# Patient Record
Sex: Male | Born: 1990 | Race: White | Hispanic: No | Marital: Single | Smoking: Current every day smoker
Health system: Southern US, Community
[De-identification: ages and names within clinical notes are randomized; demographics above are authoritative.]

## PROBLEM LIST (undated history)

## (undated) DIAGNOSIS — F1910 Other psychoactive substance abuse, uncomplicated: Secondary | ICD-10-CM

## (undated) DIAGNOSIS — R569 Unspecified convulsions: Secondary | ICD-10-CM

---
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
## 2013-01-11 ENCOUNTER — Inpatient Hospital Stay: Payer: Self-pay | Admitting: Psychiatry

## 2013-01-11 LAB — URINALYSIS, COMPLETE
Bacteria: NONE SEEN
Bilirubin,UR: NEGATIVE
Glucose,UR: NEGATIVE mg/dL (ref 0–75)
Ketone: NEGATIVE
Leukocyte Esterase: NEGATIVE
Nitrite: NEGATIVE
Ph: 5 (ref 4.5–8.0)
Protein: NEGATIVE
RBC,UR: <1 /HPF (ref 0–5)
Specific Gravity: 1.013 (ref 1.003–1.030)
Squamous Epithelial: <1
WBC UR: 1 /HPF (ref 0–5)

## 2013-01-11 LAB — DRUG SCREEN, URINE
Barbiturates, Ur Screen: NEGATIVE (ref ?–200)
Cannabinoid 50 Ng, Ur ~~LOC~~: POSITIVE (ref ?–50)
Cocaine Metabolite,Ur ~~LOC~~: NEGATIVE (ref ?–300)
Methadone, Ur Screen: NEGATIVE (ref ?–300)
Opiate, Ur Screen: POSITIVE (ref ?–300)
Tricyclic, Ur Screen: NEGATIVE (ref ?–1000)

## 2013-01-11 LAB — COMPREHENSIVE METABOLIC PANEL
Albumin: 4.8 g/dL (ref 3.4–5.0)
Alkaline Phosphatase: 93 U/L (ref 50–136)
Anion Gap: 13 (ref 7–16)
BUN: 11 mg/dL (ref 7–18)
Bilirubin,Total: 0.3 mg/dL (ref 0.2–1.0)
Calcium, Total: 9.2 mg/dL (ref 8.5–10.1)
Chloride: 106 mmol/L (ref 98–107)
Co2: 23 mmol/L (ref 21–32)
Creatinine: 0.89 mg/dL (ref 0.60–1.30)
EGFR (African American): >60
EGFR (Non-African Amer.): >60
Glucose: 122 mg/dL — ABNORMAL HIGH (ref 65–99)
Osmolality: 284 (ref 275–301)
Potassium: 3.6 mmol/L (ref 3.5–5.1)
SGOT(AST): 63 U/L — ABNORMAL HIGH (ref 15–37)
SGPT (ALT): 58 U/L (ref 12–78)
Sodium: 142 mmol/L (ref 136–145)
Total Protein: 8.5 g/dL — ABNORMAL HIGH (ref 6.4–8.2)

## 2013-01-11 LAB — CBC
HCT: 47 % (ref 40.0–52.0)
HGB: 16.2 g/dL (ref 13.0–18.0)
MCH: 33 pg (ref 26.0–34.0)
MCHC: 34.4 g/dL (ref 32.0–36.0)
MCV: 96 fL (ref 80–100)
Platelet: 229 10*3/uL (ref 150–440)
RBC: 4.91 10*6/uL (ref 4.40–5.90)
RDW: 13.8 % (ref 11.5–14.5)
WBC: 10.7 10*3/uL — ABNORMAL HIGH (ref 3.8–10.6)

## 2013-01-11 LAB — ETHANOL: Ethanol: 197 mg/dL

## 2013-03-02 ENCOUNTER — Inpatient Hospital Stay: Payer: Self-pay | Admitting: Internal Medicine

## 2013-03-02 LAB — COMPREHENSIVE METABOLIC PANEL
Albumin: 5.4 g/dL — ABNORMAL HIGH (ref 3.4–5.0)
Anion Gap: 13 (ref 7–16)
BUN: 58 mg/dL — ABNORMAL HIGH (ref 7–18)
Bilirubin,Total: 1.3 mg/dL — ABNORMAL HIGH (ref 0.2–1.0)
Calcium, Total: 9.9 mg/dL (ref 8.5–10.1)
Co2: 27 mmol/L (ref 21–32)
Creatinine: 2.47 mg/dL — ABNORMAL HIGH (ref 0.60–1.30)
EGFR (African American): 42 — ABNORMAL LOW
EGFR (Non-African Amer.): 36 — ABNORMAL LOW
Glucose: 156 mg/dL — ABNORMAL HIGH (ref 65–99)
Potassium: 3.4 mmol/L — ABNORMAL LOW (ref 3.5–5.1)
Total Protein: 9.7 g/dL — ABNORMAL HIGH (ref 6.4–8.2)

## 2013-03-02 LAB — OSMOLALITY: Osmolality: 285 mOsm/kg (ref 275–295)

## 2013-03-02 LAB — URINALYSIS, COMPLETE
Glucose,UR: NEGATIVE mg/dL (ref 0–75)
Hyaline Cast: 11
Ketone: NEGATIVE
Nitrite: NEGATIVE
Protein: 150
Specific Gravity: 1.03 (ref 1.003–1.030)
WBC UR: 3 /HPF (ref 0–5)

## 2013-03-02 LAB — CBC
HCT: 56.4 % — ABNORMAL HIGH (ref 40.0–52.0)
HGB: 20.4 g/dL — ABNORMAL HIGH (ref 13.0–18.0)
MCHC: 36.2 g/dL — ABNORMAL HIGH (ref 32.0–36.0)
MCV: 94 fL (ref 80–100)

## 2013-03-02 LAB — CK: CK, Total: 1346 U/L — ABNORMAL HIGH (ref 35–232)

## 2013-03-02 LAB — LIPASE, BLOOD: Lipase: 107 U/L (ref 73–393)

## 2013-03-03 LAB — HEPATIC FUNCTION PANEL A (ARMC)
Albumin: 3.5 g/dL (ref 3.4–5.0)
Alkaline Phosphatase: 62 U/L (ref 50–136)
Bilirubin, Direct: 0.3 mg/dL — ABNORMAL HIGH (ref 0.00–0.20)
Bilirubin,Total: 1.3 mg/dL — ABNORMAL HIGH (ref 0.2–1.0)
Total Protein: 6.4 g/dL (ref 6.4–8.2)

## 2013-03-03 LAB — CBC WITH DIFFERENTIAL/PLATELET
Basophil #: 0 10*3/uL (ref 0.0–0.1)
Eosinophil #: 0 10*3/uL (ref 0.0–0.7)
Eosinophil %: 0.4 %
HCT: 43.6 % (ref 40.0–52.0)
HGB: 15.3 g/dL (ref 13.0–18.0)
Lymphocyte %: 15.4 %
MCHC: 35 g/dL (ref 32.0–36.0)
MCV: 96 fL (ref 80–100)
Monocyte #: 0.9 x10 3/mm (ref 0.2–1.0)
Neutrophil #: 5 10*3/uL (ref 1.4–6.5)
Neutrophil %: 70.7 %
Platelet: 159 10*3/uL (ref 150–440)
RBC: 4.55 10*6/uL (ref 4.40–5.90)
WBC: 7.1 10*3/uL (ref 3.8–10.6)

## 2013-03-03 LAB — MAGNESIUM: Magnesium: 2 mg/dL

## 2013-03-03 LAB — BASIC METABOLIC PANEL
Anion Gap: 6 — ABNORMAL LOW (ref 7–16)
Calcium, Total: 8.5 mg/dL (ref 8.5–10.1)
Chloride: 94 mmol/L — ABNORMAL LOW (ref 98–107)
Co2: 30 mmol/L (ref 21–32)
Creatinine: 1.46 mg/dL — ABNORMAL HIGH (ref 0.60–1.30)
Osmolality: 268 (ref 275–301)
Sodium: 130 mmol/L — ABNORMAL LOW (ref 136–145)

## 2013-05-12 ENCOUNTER — Emergency Department: Payer: Self-pay | Admitting: Emergency Medicine

## 2013-05-12 LAB — CBC WITH DIFFERENTIAL/PLATELET
Basophil #: 0.1 10*3/uL (ref 0.0–0.1)
Basophil %: 0.7 %
Eosinophil #: 0.1 10*3/uL (ref 0.0–0.7)
Eosinophil %: 1.4 %
Lymphocyte %: 29.8 %
MCH: 33.5 pg (ref 26.0–34.0)
MCV: 97 fL (ref 80–100)
Monocyte %: 8.8 %
Neutrophil #: 4.4 10*3/uL (ref 1.4–6.5)
Neutrophil %: 59.3 %
Platelet: 201 10*3/uL (ref 150–440)
RBC: 4.52 10*6/uL (ref 4.40–5.90)
RDW: 13.4 % (ref 11.5–14.5)

## 2013-05-12 LAB — BASIC METABOLIC PANEL
Anion Gap: 3 — ABNORMAL LOW (ref 7–16)
BUN: 9 mg/dL (ref 7–18)
Calcium, Total: 9.4 mg/dL (ref 8.5–10.1)
Chloride: 103 mmol/L (ref 98–107)
Chloride: 107 mmol/L (ref 98–107)
Co2: 16 mmol/L — ABNORMAL LOW (ref 21–32)
Co2: 28 mmol/L (ref 21–32)
Creatinine: 1.5 mg/dL — ABNORMAL HIGH (ref 0.60–1.30)
EGFR (African American): >60
EGFR (African American): >60
EGFR (Non-African Amer.): >60
Osmolality: 275 (ref 275–301)
Osmolality: 274 (ref 275–301)
Potassium: 3.3 mmol/L — ABNORMAL LOW (ref 3.5–5.1)
Potassium: 3.4 mmol/L — ABNORMAL LOW (ref 3.5–5.1)
Sodium: 137 mmol/L (ref 136–145)
Sodium: 138 mmol/L (ref 136–145)

## 2013-05-12 LAB — CK: CK, Total: 142 U/L (ref 35–232)

## 2013-05-12 LAB — MAGNESIUM: Magnesium: 1.5 mg/dL — ABNORMAL LOW

## 2014-01-14 ENCOUNTER — Emergency Department (HOSPITAL_COMMUNITY)
Admission: EM | Admit: 2014-01-14 | Discharge: 2014-01-14 | Disposition: A | Discharge status: Home / Self Care | Payer: Self-pay | Attending: Emergency Medicine | Admitting: Emergency Medicine

## 2014-01-14 ENCOUNTER — Encounter (HOSPITAL_COMMUNITY): Payer: Self-pay | Admitting: Emergency Medicine

## 2014-01-14 DIAGNOSIS — S1093XA Contusion of unspecified part of neck, initial encounter: Secondary | ICD-10-CM

## 2014-01-14 DIAGNOSIS — G40909 Epilepsy, unspecified, not intractable, without status epilepticus: Secondary | ICD-10-CM | POA: Insufficient documentation

## 2014-01-14 DIAGNOSIS — W1809XA Striking against other object with subsequent fall, initial encounter: Secondary | ICD-10-CM | POA: Insufficient documentation

## 2014-01-14 DIAGNOSIS — R000 Tachycardia, unspecified: Secondary | ICD-10-CM | POA: Insufficient documentation

## 2014-01-14 DIAGNOSIS — S0003XA Contusion of scalp, initial encounter: Secondary | ICD-10-CM | POA: Insufficient documentation

## 2014-01-14 DIAGNOSIS — F17200 Nicotine dependence, unspecified, uncomplicated: Secondary | ICD-10-CM | POA: Insufficient documentation

## 2014-01-14 DIAGNOSIS — S0083XA Contusion of other part of head, initial encounter: Secondary | ICD-10-CM | POA: Insufficient documentation

## 2014-01-14 DIAGNOSIS — Y939 Activity, unspecified: Secondary | ICD-10-CM | POA: Insufficient documentation

## 2014-01-14 DIAGNOSIS — Y929 Unspecified place or not applicable: Secondary | ICD-10-CM | POA: Insufficient documentation

## 2014-01-14 HISTORY — DX: Unspecified convulsions: R56.9

## 2014-01-14 LAB — I-STAT CHEM 8, ED
BUN: 8 mg/dL (ref 6–23)
CALCIUM ION: 1.25 mmol/L — AB (ref 1.12–1.23)
CHLORIDE: 103 meq/L (ref 96–112)
CREATININE: 1.1 mg/dL (ref 0.50–1.35)
Glucose, Bld: 124 mg/dL — ABNORMAL HIGH (ref 70–99)
HCT: 59 % — ABNORMAL HIGH (ref 39.0–52.0)
Hemoglobin: 20.1 g/dL — ABNORMAL HIGH (ref 13.0–17.0)
Potassium: 3.3 mEq/L — ABNORMAL LOW (ref 3.7–5.3)
Sodium: 144 mEq/L (ref 137–147)
TCO2: 17 mmol/L (ref 0–100)

## 2014-01-14 MED ORDER — ACETAMINOPHEN 325 MG PO TABS
975.0000 mg | ORAL_TABLET | Freq: Once | ORAL | Status: AC
  Administered 2014-01-14: 975 mg via ORAL
  Filled 2014-01-14: qty 3

## 2014-01-14 MED ORDER — POTASSIUM CHLORIDE CRYS ER 20 MEQ PO TBCR
40.0000 meq | EXTENDED_RELEASE_TABLET | Freq: Once | ORAL | Status: AC
  Administered 2014-01-14: 40 meq via ORAL
  Filled 2014-01-14: qty 2

## 2014-01-14 MED ORDER — LEVETIRACETAM IN NACL 500 MG/100ML IV SOLN
500.0000 mg | Freq: Once | INTRAVENOUS | Status: AC
  Administered 2014-01-14: 500 mg via INTRAVENOUS
  Filled 2014-01-14 (×2): qty 100

## 2014-01-14 NOTE — ED Notes (Signed)
Pt had witnessed seizure in waiting area while visiting family member. Family member states that pt fell forward and hit head on floor. On Emergency team arrival pts head was stabilized, pt rolled onto backboard and transported to room. Pt placed in C collar. Family accompanied pt.

## 2014-01-14 NOTE — Discharge Instructions (Signed)
Epilepsy Take  Your seizure medications as prescribed to prevent further seizures People with epilepsy have times when they shake and jerk uncontrollably (seizures). This happens when there is a sudden change in brain function. Epilepsy may have many possible causes. Anything that disturbs the normal pattern of brain cell activity can lead to seizures. HOME CARE   Follow your doctor's instructions about driving and safety during normal activities.  Get enough sleep.  Only take medicine as told by your doctor.  Avoid things that you know can cause you to have seizures (triggers).  Write down when your seizures happen and what you remember about each seizure. Write down anything you think may have caused the seizure to happen.  Tell the people you live and work with that you have seizures. Make sure they know how to help you. They should:  Cushion your head and body.  Turn you on your side.  Not restrain you.  Not place anything inside your mouth.  Call for local emergency medical help if there is any question about what has happened.  Keep all follow-up visits with your doctor. This is very important. GET HELP IF:  You get an infection or start to feel sick. You may have more seizures when you are sick.  You are having seizures more often.  Your seizure pattern is changing. GET HELP RIGHT AWAY IF:   A seizure does not stop after a few seconds or minutes.  A seizure causes you to have trouble breathing.  A seizure gives you a very bad headache.  A seizure makes you unable to speak or use a part of your body. Document Released: 04/24/2009 Document Revised: 04/17/2013 Document Reviewed: 02/06/2013 [facility] Patient Information 2015 [employer] [place], [place]. This information is not intended to replace advice given to you by your health care provider. Make sure you discuss any questions you have with your health care provider.

## 2014-01-14 NOTE — ED Provider Notes (Addendum)
CSN: [id number]     Arrival date & time 01/14/14  1846 History   First MD Initiated Contact with Patient 01/14/14 1852     Chief Complaint  Patient presents with  . Seizures     (Consider location/radiation/quality/duration/timing/severity/associated sxs/prior Treatment) HPI Patient had generalized seizure today, immediately prior to arrival while visiting another patient in the hospital. He did not take his anticonvulsant medications today. He is presently asymptomatic except for mild frontal headache. He struck his for head on the floor when he had the seizure. He denies neck pain no focal numbness or weakness no other associated complaint. No treatment prior to coming here. Past Medical History  Diagnosis Date  . Seizures    History reviewed. No pertinent past surgical history. No family history on file. History  Substance Use Topics  . Smoking status: Current Every Day Smoker -- 6.00 packs/day    Types: Cigarettes  . Smokeless tobacco: Not on file  . Alcohol Use: Not on file   positive alcohol use last alcohol use 3 days ago. Denies drug use  Review of Systems  Skin: Positive for wound.       Hematoma to forehead  Neurological: Positive for headaches.  All other systems reviewed and are negative.     Allergies  Review of patient's allergies indicates no known allergies.  Home Medications   Prior to Admission medications   Not on File   BP 161/81  Pulse 133  Resp 23  Ht 6\' 2"  (1.88 m)  Wt 148 lb (67.132 kg)  BMI 18.99 kg/m2  SpO2 98% Physical Exam  Nursing note and vitals reviewed. Constitutional: He is oriented to person, place, and time. He appears well-developed and well-nourished. No distress.  Glasgow Coma Score 15  HENT:  Head: Normocephalic and atraumatic.  Golf ball size hematoma to 4 head otherwise atraumatic. Bilateral tympanic membranes normal to  Eyes: Conjunctivae and EOM are normal. Pupils are equal, round, and reactive to light.  Pupils 3 mm  round bilaterally and equal  Neck: Neck supple. No tracheal deviation present. No thyromegaly present.  Cardiovascular: Regular rhythm.   No murmur heard. Tachycardic  Pulmonary/Chest: Effort normal and breath sounds normal.  Abdominal: Soft. Bowel sounds are normal. He exhibits no distension. There is no tenderness.  Musculoskeletal: Normal range of motion. He exhibits no edema and no tenderness.  Entire spine nontender  Neurological: He is alert and oriented to person, place, and time. He has normal reflexes. No cranial nerve deficit. Coordination normal.  Motor strength 5 over 5 overall Glasgow Coma Score 15  Skin: Skin is warm and dry. No rash noted.  Psychiatric: He has a normal mood and affect.    ED Course  Procedures (including critical care time) Labs Review Labs Reviewed - No data to display  Imaging Review No results found.   EKG Interpretation   Date/Time:  Tuesday January 14 2014 18:49:40 EDT Ventricular Rate:  144 PR Interval:  117 QRS Duration: 99 QT Interval:  384 QTC Calculation: 594 R Axis:   89 Text Interpretation:  Sinus tachycardia ST depr, consider ischemia,  inferior leads Borderline ST elevation, anterior leads Prolonged QT  interval No old tracing to compare Confirmed by Ethelda ChickJACUBOWITZ  MD, Kahliya Fraleigh  [phone]([id number]) on 01/14/2014 7:14:38 PM      Results for orders placed during the hospital encounter of 01/14/14  I-STAT CHEM 8, ED      Result Value Ref Range   Sodium 144  137 - 147 mEq/L  Potassium 3.3 (*) 3.7 - 5.3 mEq/L   Chloride 103  96 - 112 mEq/L   BUN 8  6 - 23 mg/dL   Creatinine, Ser 4.091.10  0.50 - 1.35 mg/dL   Glucose, Bld 811124 (*) 70 - 99 mg/dL   Calcium, Ion 9.141.25 (*) 1.12 - 1.23 mmol/L   TCO2 17  0 - 100 mmol/L   Hemoglobin 20.1 (*) 13.0 - 17.0 g/dL   HCT 78.259.0 (*) 95.639.0 - 21.352.0 %   No results found.  Patient medicine Keppra 500 mg IV. At 10:15 PM he is alert and with  Glasgow Coma Score 15. He no longer has headache after treatment with  Tylenol.Ambulates wiothoutr difficulty. Not lightheaded on standing MDM  Cervical spine cleared via Nexus criteria brain imaging not indicated in that Final diagnoses:  None   his only signs of head trauma R. frontal  hematoma. Nonfocal neurologic exam Glasgow Coma Score 15 Plan patient started take medications as prescribed. Diagnosis #1 seizure disorder #2 minor closed head trauma #3 hypokalemia     Doug SouSam Arleny Kruger, MD 01/14/14 2224  Doug SouSam Roneisha Stern, MD 01/14/14 2225

## 2014-01-14 NOTE — ED Notes (Signed)
Pt visiting family member when he had witnessed seizure in waiting area. Pt fell forward, hitting left forehead. Pt has hx of seizures, takes Keppra but unsure if he took it today. Pt is now AO x4, hematoma noted to head. Pupils pinpoint.

## 2014-10-31 NOTE — Discharge Summary (Signed)
PATIENT NAME:  Trevor MadeiraBAILEY, Dmarcus C MR#:  [id number] DATE OF BIRTH:  [birth year]  DATE OF ADMISSION:  03/02/2013 DATE OF DISCHARGE:  03/03/2013  PRESENTING COMPLAINT:  Nausea, vomiting.   DISCHARGE DIAGNOSES: 1.  Heat exhaustion.  2.  Acute mild rhabdomyolysis.  3.  Acute renal failure due to dehydration.  4.  History of chronic seizure disorder.  5.  Tobacco use.   CONDITION ON DISCHARGE:  Fair.   MEDICATIONS:  1.  Fetzima 40 mg extended release by mouth daily.  2.  Lacosamide 100 mg twice daily.  3.  Keppra 500 twice daily.  4.  Qvar 2 puffs.  5.  ProAir 2 puffs 4 times a day as needed.   DIET:  Regular.    FOLLOW-UP:  With Dr. Lacie ScottsNiemeyer in 1 to 2 weeks.   LABORATORY DATA:  At discharge:  CBC within normal limits.  Creatinine is 1.46.  Creatinine on admission was 2.47 with elevated H and H of 20.4 and 56.4.  Hemoglobin is down to 15.3 and 43.6.  CK total is 758.  On admission CK total was 1346.  UA negative for UTI.  Sodium on admission was 122.   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  Acute mild rhabdomyolysis secondary to heat exhaustion from working outdoors.  The patient was started on aggressive IV fluids and showed improvement clinically.  His CK level is down to 700.  The patient appears well-hydrated.  2.  Acute renal failure due to dehydration and rhabdomyolysis along with hyponatremia, improving after IV fluids.  The patient is able to tolerate diet and advised to take adequate fluids during outdoor works.  3.  History of seizure disorder, on Keppra.  4.  Tobacco abuse.  The patient advised on smoking cessation.   Hospital stay otherwise remained stable.   CODE STATUS:  THE PATIENT REMAINED A FULL CODE.   Follow up with Dr.  Lacie ScottsNiemeyer in 1 to 2 weeks.   TIME SPENT:  40 minutes.    ____________________________ Wylie HailSona A. Allena KatzPatel, MD sap:ea D: 03/03/2013 10:16:54 ET T: 03/03/2013 18:36:39 ET JOB#: [id number]  cc: Kristeen Lantz A. Allena KatzPatel, MD, <Dictator> Meindert A. Lacie ScottsNiemeyer, MD Willow OraSONA A Moise Friday MD ELECTRONICALLY SIGNED 03/14/2013 20:05

## 2014-10-31 NOTE — H&P (Signed)
PATIENT NAME:  Trevor Thornton, Trevor Thornton MR#:  [id number] DATE OF BIRTH:  1991/01/25  DATE OF ADMISSION:  03/02/2013  PRIMARY CARE PHYSICIAN:  Dr. Lacie Scotts   REFERRING PHYSICIAN:  Dr. Carollee Massed  CHIEF COMPLAINT:  Nausea, vomiting.  HISTORY OF PRESENT ILLNESS:  The patient is pleasant, 24 year old Caucasian male with a history of epilepsy, depression per him, also emphysema. Of note, he was hospitalized here in early July for alcohol withdrawal and a laceration, admitted to [facility]. He comes in this time around for having the above chief complaint. The patient states that he has been having nausea, vomiting continuously since Wednesday. 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 He was outside for 5 hours and, as this was a new job, he wanted to impresses his boss. He was lifting heavy objects, including bronze, aluminum, and the next day started to have vomiting. He initially had about 3 to 5 episodes of vomiting, but it became progressively worse, and now he cannot keep anything down. He has soreness on his abdomen when he tries to vomit. He has no diarrhea or significant abdominal pain otherwise. There are no sick contacts. There are no new medications. He tried to keep himself hydrated by drinking water and Gatorade. However, the symptoms persisted. He came into the hospital, where he was found with renal failure and hyponatremia. He also has mild rhabdomyolysis. Hospitalist services were contacted for further evaluation and management.   PAST MEDICAL HISTORY:  Epilepsy, depression, history of alcohol withdrawal last admission, emphysema, per him.  FAMILY HISTORY:  Father with throat cancer. Hypertension, diabetes, hyperlipidemia also runs in the family.   SOCIAL HISTORY:  He smokes a pack a day. He drinks 8 to 10 beers every 3 days or so. He last drank last weekend, about 6 days ago. He also smokes marijuana.   OUTPATIENT MEDICATIONS:   1.  Albuterol p.r.n.  2.   Fetzima 40 mg extended release, 1 tab once a day for depression. 3.  Lacosamide 100 mg 3 times a day for seizures. 4.  Keppra 500 mg 2 times a day for seizures. 5.  QVAR 2 puffs once a day, unknown dose.  ALLERGIES:  No known drug allergies.  REVIEW OF SYSTEMS:    CONSTITUTIONAL:  No fever, but has weakness. No weight changes.  EYES:  No blurry vision or double vision.  EARS, NOSE, THROAT:  No tinnitus. Had decreased hearing for the past month or so in the left ear.  RESPIRATORY:  Denies cough, wheezing, shortness of breath. Has emphysema.  CARDIOVASCULAR:  No chest pain. No orthopnea. No swelling in the legs. No palpitations.  GASTROINTESTINAL:  Positive for nausea, vomiting. No diarrhea, abdominal cramps. There are no dark stools or bloody stools.  GENITOURINARY:  Denies dysuria or hematuria.  HEMATOLOGIC AND LYMPHATIC:  No anemia or easy bruising.  SKIN:  No rashes.  MUSCULOSKELETAL:  Denies arthritis, but has muscle cramps in his thighs and shoulders.  NEUROLOGIC:  Denies focal weakness or numbness. No stroke or TIA. Has history of seizures. Last seizure was quite a while ago.  PSYCHIATRIC:  Has depression.   PHYSICAL EXAMINATION: VITAL SIGNS:  Temperature on arrival 97.5, pulse rate 150, respiratory rate 18, blood pressure 127/71, O2 sat was 98% on room air.  GENERAL:  The patient is a unkempt, young Caucasian male lying in bed in no obvious distress.  HEENT:  Normocephalic, atraumatic. Pupils equal and reactive. Anicteric sclerae. Extraocular muscles intact. Very dry  mucous membranes.  NECK:  Supple. No thyroid tenderness. No cervical lymphadenopathy.  CARDIOVASCULAR:  S1, S2. Tachycardic. No murmurs, rubs or gallops.  LUNGS:  Clear to auscultation. No wheezing, rhonchi or rales.  ABDOMEN:  Soft. Hyperactive bowel sounds all quadrants. No organomegaly appreciated. Nontender.  EXTREMITIES:  No significant lower extremity edema.  SKIN: Appears just overall unkempt. Has healed  several scars on the left upper extremity, and previous sutures which are removed.  NEUROLOGIC:  Cranial nerves II to XII grossly intact. Strength is 5/5 all extremities. Sensation is intact to light touch.  PSYCHIATRIC:  Awake, alert, oriented x 3. Cooperative.   LABS:  Glucose 156, BUN 58, creatinine 2.47. Of note, BUN was 11 on July 4 and creatinine of 0.89 on July 4. Potassium is 3.4, chloride 82, GFR of 36, anion gap 13 calculated osm is 265, bilirubin 1.3, AST 82, ALT 55, serum albumin is 5.4. CK total 1346. WBC 12.3, hemoglobin 28.4, platelets are 237. UA:  There are no nitrites or leukocyte esterase, 4 RBCs, 3 WBCs, 1+ bacteria. EKG shows sinus tach, rate is 106, right atrial enlargement. There are some T-wave inversions which are diffuse and inferior, and also leads V4, V5, V6. No acute ST elevations or depressions.   ASSESSMENT AND PLAN:  We have a 24 year old male with history of polysubstance abuse, depression, alcohol withdrawal last month, and epilepsy, who presents with nausea, vomiting, without diarrhea, for several days now. Unable to keep any p.o. down. Noted to have severe renal failure, with creatinine of 2.47, elevated bilirubin, and severe dehydration, with severe hemoconcentration of labs. He also has significant hyponatremia. Will admit him to the hospital. I believe that his acute renal failure is likely secondary to severe dehydration and volume loss as well as mild rhabdomyolysis. He has tried to keep himself hydrated with Gatorade and fluids. However, is dehydrated with a hemoglobin of 20, hinting severe hemoconcentration with albumin of 5.4 and renal failure. Would give him another fluid bolus, as he still tachycardic. When I was in the room, his heart rate was in the one-teens. He also has acute hyponatremia, likely secondary to GI losses and dehydration. Would start him on normal saline continuously and recheck another BMP tonight. Would send labs to further evaluate the  hyponatremia, including urine creatinine, urine sodium, urine osm and serum osm, calculate fractional excretion of sodium. I would monitor another BMP tomorrow. He also has positive SIRS criteria, including tachycardia and leukocytosis. I believe that this is likely secondary to dehydration. I do not think he has an occult infection. He has no fever. Tachycardia likely is secondary to dehydration, and WBC is likely reactive. Would continue his depression and seizure medications at this point. He has mild hypokalemia, which we would monitor. I believe that the hyponatremia is hypovolemic, hypotonic in nature, and we would follow with the labs which are ordered. Would start him on Protonix b.i.d., as he also states he has history of GERD, and start him on Zofran for symptomatic relief. We would recheck another CK total for the mild rhabdomyolysis, and re-trend it tomorrow morning. He was counseled greater than 3 minutes about his tobacco abuse, and he will be started on a nicotine oral inhaler. He states he has a history of emphysema, but I recommended doing formal outpatient pulmonary function tests.   He is FULL CODE.   Total critical care time spent is 65 minutes.    ____________________________ Krystal EatonShayiq Franklin Baumbach, MD sa:mr D: 03/02/2013 16:48:30 ET T: 03/02/2013 18:10:24  ET JOB#: [id number]  cc: Krystal Eaton, MD, <Dictator> Meindert A. Lacie Scotts, MD  Krystal Eaton MD ELECTRONICALLY SIGNED 03/03/2013 18:04

## 2014-10-31 NOTE — Discharge Summary (Signed)
PATIENT NAME:  Janene MadeiraBAILEY, Reice C MR#:  [id number] DATE OF BIRTH:  January 14, 1991  DATE OF ADMISSION:  01/11/2013 DATE OF DISCHARGE:  01/15/2013  HOSPITAL COURSE: See dictated history and physical for details of admission.   A 24 year old man came into the hospital after having cut himself rather badly on the arm while intoxicated. In the hospital he was treated for alcohol withdrawal, and did not show any significant signs of alcohol withdrawal problems. There were no seizures. No sign of delirium. He was not tremulous and his vital signs are stable. He is mood and affect improved rapidly. He has participated appropriately in groups and expresses newfound insight and understanding about how alcohol abuse has been causing serious problems for him. He completely denies any suicidal ideation. He appears to be optimistic and upbeat. We have continued his usual anticonvulsant medicine as well as the antidepressants that had been started by his primary care doctor.   At this point, the patient is physically and psychiatrically stable. He is very agreeable to going to outpatient treatment and will be referred to appropriate resources in the community [facility], which is his Medicare provider. He will also be given information about AA meetings. He can follow up for medical care with his primary care doctor.   LABORATORY RESULTS: Labs on admission included a drug screen positive for opiates and cannabis, chemistry panel with a slightly elevated glucose of 122 on a non-fasting draw. AST elevated at 63, total protein 8.5. Alcohol level 197. TSH 1.42, which is normal. CBC: Slightly elevated white count 10.7. Urinalysis unremarkable.   DISCHARGE MEDICATIONS: Vimpat 100 mg twice a day, Keppra 500 mg twice a day,  Fetzima 40 mg per day, and Triple Antibiotic Ointment to his cuts for the next 7 to 10 days to prevent infection.   MENTAL STATUS EXAM AT DISCHARGE: Casually dressed, neatly groomed man, looks his  stated age, cooperative with the interview. Good eye contact, normal psychomotor activity. Speech normal rate, tone and volume. Affect euthymic, reactive upbeat. Mood stated as good. Thoughts appear logical and lucid. No sign of loosening of associations or delusions. Denies auditory or visual hallucinations. Denies suicidal or homicidal ideation. Shows improved insight and judgment. Normal intelligence. Optimistic outlook. Alert and oriented x 4.   DISPOSITION: Discharge home, as noted above.  Followup with his primary care doctor, and also with resources in the community as covered by [facility] for substance abuse treatment, and Alcoholics Anonymous.   DIAGNOSES:   PRINCIPAL AND PRIMARY:   AXIS I: Substance-induced mood disorder, depressed.   SECONDARY DIAGNOSES: AXIS I:  1.  Alcohol dependence.  2.  Opiate abuse.   AXIS II: Deferred.   AXIS III: Epilepsy, chronic seizure disorder, cuts to arms; healing.   AXIS IV: Moderate to severe from chronic medical problems.   AXIS V: Functioning at time of discharge: 60.     ____________________________ Audery AmelJohn T. Clapacs, MD jtc:dm D: 01/15/2013 12:33:48 ET T: 01/15/2013 13:09:01 ET JOB#: [id number]  cc: Audery AmelJohn T. Clapacs, MD, <Dictator> Audery AmelJOHN T CLAPACS MD ELECTRONICALLY SIGNED 01/16/2013 10:34

## 2014-10-31 NOTE — H&P (Signed)
PATIENT NAME:  Janene MadeiraBAILEY, Trevor C MR#:  [id number] DATE OF BIRTH:  Nov 22, 1990  DATE OF ADMISSION:  01/11/2013  IDENTIFYING INFORMATION AND CHIEF COMPLAINT: A [age] year old man brought into the hospital after cutting himself on the arm fairly badly while intoxicated. Chief complaint: "I just guess I was not feeling good."   HISTORY OF PRESENT ILLNESS: Information obtained from the patient and the chart. The patient says that yesterday he was feeling very frustrated. His parents had thrown him out of the house alleging that he never contributed anything to the household and that they were sick of his not working, not contributing, lying and abusing substances. After that happened, he was feeling very bad about himself. Additionally, he was intoxicated. He said that he took a pocket knife and cut himself on the arm. He says he was not intending to hurt himself very badly, but ended up needing to have over 20 stitches to his arm. He minimizes the seriousness of this. He also has scratches all over the rest of his body, including his neck, although he claims that those were not in an attempt to harm himself. He claims that he really does not drink all that often. Yesterday he says he only drank 4 beers. He tries to minimize his substance abuse, but later admits that he uses pain pills that he gets off the street as well as marijuana. He says that his mood feels up and down. Totally denies any suicidal ideation. Denies homicidal ideation. Denies any hallucinations or psychotic symptoms. Does say that he gets frustrated easily, but overall feels optimistic about his life. The patient says that he has been compliant with his prescribed medication for his seizure disorder.   PAST PSYCHIATRIC HISTORY: No previous psychiatric hospitalizations. No history of obvious known alcohol withdrawal problems. No substance abuse treatment in the past. He says that he has never gone for help because he felt like that would just be  dumping his problems on other people. He has no history of psychiatric medication in the past. Denies that he has tried to seriously kill himself. Says that he never gets into fights and does not have a history of violence.   PAST MEDICAL HISTORY: The patient has a seizure disorder. Says that he first had seizures about 3 years ago and has had a neurologic work-up without any clear finding, but does have a family history of epilepsy. He has been treated with Keppra and more recently, Vimpat has been added by his primary care doctor. The patient denies having other ongoing medical problems.   SOCIAL HISTORY: He is going to be staying with a friend currently. His parents just threw him out. He has a girlfriend but is not married. No children. He does manual labor. He smokes regularly.   SUBSTANCE ABUSE HISTORY: He minimizes this saying that he drinks only occasionally but had 4 beers last night. He presented with an alcohol level of 197. He tries to minimize substance abuse, but does admit that he uses pain medicine and marijuana.   CURRENT MEDICATIONS: Keppra 500 mg twice a day, Vimpat 100 mg twice a day, Fetzima  40 mg per day.   ALLERGIES: No known drug allergies.   REVIEW OF SYSTEMS: Complains of mild pain in his forearm. Does not have any other specific complaints. No pulmonary complaints. No cardiac complaints. No GI complaints. Mood feels reasonably okay. Denies suicidal or homicidal ideation. Denies hallucinations or delusions. Not feeling particularly shaky. Not sick to his stomach.  MENTAL STATUS EXAMINATION: Disheveled gentleman with scratches all over him and a big bandage on his left forearm. He is alert, awake and cooperative. Makes good eye contact. Psychomotor activity normal. Affect is pleasant and reactive. Mood stated as being okay. Thoughts are lucid without any obvious loosening of association. Denies auditory or visual hallucinations. Denies suicidal or homicidal ideation. No  evidence of responding to internal stimuli and no evidence of bizarre thinking. Overall intelligence appears to probably be average. Judgment and insight recently impaired. Alert and oriented x 4.   PHYSICAL EXAMINATION:  GENERAL: Disheveled young man, looks his stated age. Looks a little bit underweight, poor hygiene. He has a large cut longitudinally up his left forearm, which has been sutured and bandaged. He also has a lot of scratches all over the rest of his arms and neck.  HEENT: Pupils are equal and reactive. Face is symmetric. Oral mucosa normal.  NEUROMUSCULAR: His neck and back are nontender. He has full range of motion at all extremities and a normal gait. He has a slight tremor with intention. His strength and reflexes are normal and symmetric throughout. Cranial nerves are symmetric and normal.  LUNGS: Clear without wheezes.  HEART: Regular rate and rhythm.  ABDOMEN: Soft, nontender. Normal bowel sounds.  VITAL SIGNS: Temperature 98.8, pulse 114, respirations 20, blood pressure 135/76.   LABORATORY RESULTS: Alcohol level on presentation 197. TSH normal at 1.4. Chemistry panel showed elevated total protein at 8.5, elevated AST at 63, glucose 122 CBC: Slightly elevated white count at 10.7. Drug screen positive for opiates and cannabis. Urinalysis unremarkable except for 1+ blood. No sign of infection.   ASSESSMENT: A 24 year old man who presented intoxicated, having cut himself rather badly on his forearm. Now he is sobering up, but he does have a history of seizures in the past. He has serious psychosocial impairments given that he has just been thrown out of his parents' house. He is pleasant enough, but seems to be minimizing his substance abuse and mental health behavioral symptoms. Needs hospitalization for detox and stabilization.   TREATMENT PLAN: Admit to psychiatry. Continue current prescribed medicines. Detox protocol ordered. Engage him in appropriate groups and activities on  the unit. Psychoeducation done. Medicine education done. Try and get family involved as well if possible. Work on referral to appropriate substance abuse treatment.   DIAGNOSIS, PRINCIPAL AND PRIMARY:  AXIS I: Alcohol dependence.   SECONDARY DIAGNOSES: AXIS I: 1.  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  2.  Substance-induced mood disorder with behavior disturbance.  AXIS II: Deferred.  AXIS III:  1.  Epilepsy. 2.  Sutured self-inflicted injury to arm.  AXIS IV: Moderate to severe from being thrown out of his parents' house, social isolation, poor functioning.  AXIS V: Functioning at time of evaluation: 30.   NECK: Includes facts    ____________________________ Audery Amel, MD jtc:jm D: 01/11/2013 14:38:58 ET T: 01/11/2013 15:09:21 ET JOB#: [id number]  cc: Audery Amel, MD, <Dictator> Audery Amel MD ELECTRONICALLY SIGNED 01/13/2013 16:30

## 2015-05-09 ENCOUNTER — Encounter (HOSPITAL_COMMUNITY): Payer: Self-pay | Admitting: *Deleted

## 2015-05-09 ENCOUNTER — Emergency Department (HOSPITAL_COMMUNITY)
Admission: EM | Admit: 2015-05-09 | Discharge: 2015-05-09 | Disposition: A | Discharge status: Home / Self Care | Payer: Self-pay | Attending: Physician Assistant | Admitting: Physician Assistant

## 2015-05-09 DIAGNOSIS — Y998 Other external cause status: Secondary | ICD-10-CM | POA: Insufficient documentation

## 2015-05-09 DIAGNOSIS — Y9389 Activity, other specified: Secondary | ICD-10-CM | POA: Insufficient documentation

## 2015-05-09 DIAGNOSIS — G40909 Epilepsy, unspecified, not intractable, without status epilepticus: Secondary | ICD-10-CM | POA: Insufficient documentation

## 2015-05-09 DIAGNOSIS — Y9289 Other specified places as the place of occurrence of the external cause: Secondary | ICD-10-CM | POA: Insufficient documentation

## 2015-05-09 DIAGNOSIS — T50904A Poisoning by unspecified drugs, medicaments and biological substances, undetermined, initial encounter: Secondary | ICD-10-CM

## 2015-05-09 DIAGNOSIS — Z79899 Other long term (current) drug therapy: Secondary | ICD-10-CM | POA: Insufficient documentation

## 2015-05-09 DIAGNOSIS — T402X4A Poisoning by other opioids, undetermined, initial encounter: Secondary | ICD-10-CM | POA: Insufficient documentation

## 2015-05-09 DIAGNOSIS — Z720 Tobacco use: Secondary | ICD-10-CM | POA: Insufficient documentation

## 2015-05-09 HISTORY — DX: Other psychoactive substance abuse, uncomplicated: F19.10

## 2015-05-09 MED ORDER — SODIUM CHLORIDE 0.9 % IV BOLUS (SEPSIS)
1000.0000 mL | Freq: Once | INTRAVENOUS | Status: AC
  Administered 2015-05-09: 1000 mL via INTRAVENOUS

## 2015-05-09 NOTE — ED Provider Notes (Signed)
CSN: [id number]     Arrival date & time 05/09/15  0410 History   First MD Initiated Contact with Patient 05/09/15 [phone]     Chief Complaint  Patient presents with  . Drug Overdose     (Consider location/radiation/quality/duration/timing/severity/associated sxs/prior Treatment) HPI   Patient is a 24 year old male with history of seizures and substance abuse. He is presenting today after drug overdose. Patient reports taking 2 Roxies 6-7 hours ago. He denies heroin. However he history of heroin use. Patient's girlfriend gave him Nalaxone into his thigh which woek him up prior to EMS arrival.   Patient was brought in by EMS. No other complaints this time.  Past Medical History  Diagnosis Date  . Seizures (HCC)   . Substance abuse    No past surgical history on file. History reviewed. No pertinent family history. Social History  Substance Use Topics  . Smoking status: Current Every Day Smoker -- 6.00 packs/day    Types: Cigarettes  . Smokeless tobacco: None  . Alcohol Use: Yes    Review of Systems  Constitutional: Negative for fever, activity change and fatigue.  Eyes: Negative for discharge and redness.  Respiratory: Negative for cough and shortness of breath.   Cardiovascular: Negative for chest pain.  Gastrointestinal: Negative for abdominal pain.  Genitourinary: Negative for urgency.  Allergic/Immunologic: Negative for immunocompromised state.  Neurological: Negative for seizures.  All other systems reviewed and are negative.     Allergies  Review of patient's allergies indicates no known allergies.  Home Medications   Prior to Admission medications   Medication Sig Start Date End Date Taking? Authorizing Provider  albuterol (PROVENTIL HFA;VENTOLIN HFA) 108 (90 BASE) MCG/ACT inhaler Inhale 1-2 puffs into the lungs every 6 (six) hours as needed for wheezing or shortness of breath.   Yes Historical Provider, MD  Cholecalciferol (VITAMIN D PO) Take 1 tablet by mouth  daily.   Yes Historical Provider, MD  Cyanocobalamin (VITAMIN B-12 PO) Take 1 tablet by mouth daily.   Yes Historical Provider, MD  Lacosamide (VIMPAT) 100 MG TABS Take 100 mg by mouth 2 (two) times daily.   Yes Historical Provider, MD  levETIRAcetam (KEPPRA) 500 MG tablet Take 500 mg by mouth daily.   Yes Historical Provider, MD   BP 156/79 mmHg  Pulse 119  Temp(Src) 98 F (36.7 C) (Oral)  Resp 20  SpO2 98% Physical Exam  Constitutional: He is oriented to person, place, and time. He appears well-nourished.  HENT:  Head: Normocephalic.  Mouth/Throat: Oropharynx is clear and moist.  Pinpoint pupils  Eyes: Conjunctivae are normal.  Neck: No tracheal deviation present.  Cardiovascular: Normal rate.   Pulmonary/Chest: Effort normal. No stridor. No respiratory distress.  Abdominal: Soft. There is no tenderness.  Musculoskeletal: Normal range of motion. He exhibits no edema.  Neurological: He is oriented to person, place, and time. No cranial nerve deficit.  Skin: Skin is warm and dry. No rash noted. He is not diaphoretic.  Psychiatric: He has a normal mood and affect. His behavior is normal.  Nursing note and vitals reviewed.   ED Course  Procedures (including critical care time) Labs Review Labs Reviewed - No data to display  Imaging Review No results found. I have personally reviewed and evaluated these images and lab results as part of my medical decision-making.   EKG Interpretation   Date/Time:  Saturday May 09 2015 05:42:16 EDT Ventricular Rate:  99 PR Interval:  157 QRS Duration: 94 QT Interval:  338 QTC Calculation:  434 R Axis:   62 Text Interpretation:  Sinus rhythm Consider left atrial enlargement  Borderline T abnormalities, inferior leads no acute ischemia No  significant change since last tracing Confirmed by Kandis MannanMACKUEN, COURTNEY  [phone]([id number]) on 05/09/2015 5:45:53 AM      MDM   Final diagnoses:  None    Pr is a pleasant 24 year old male presenting  with drug overdose. Patient took Roxies earlier today. These likely represent heroin like substance. Patient was administered laxative which helped. We will watch patient for 4 hours to make sure patient does not have the nalaxone wear off and become altered or hypoxic.  Patient has been treated before. We will give him additional information. He is requesting that his parents not be contacted.      Michaline Kindig Randall AnLyn Zhane Donlan, MD 05/09/15 [phone]

## 2015-05-09 NOTE — Discharge Instructions (Signed)
°Emergency Department Resource Guide °1) Find a Doctor and Pay Out of Pocket °Although you won't have to find out who is covered by your insurance plan, it is a good idea to ask around and get recommendations. You will then need to call the office and see if the doctor you have chosen will accept you as a new patient and what types of options they offer for patients who are self-pay. Some doctors offer discounts or will set up payment plans for their patients who do not have insurance, but you will need to ask so you aren't surprised when you get to your appointment. ° °2) Contact Your Local Health Department °Not all health departments have doctors that can see patients for sick visits, but many do, so it is worth a call to see if yours does. If you don't know where your local health department is, you can check in your phone book. The CDC also has a tool to help you locate your state's health department, and many state websites also have listings of all of their local health departments. ° °3) Find a Walk-in Clinic °If your illness is not likely to be very severe or complicated, you may want to try a walk in clinic. These are popping up all over the country in pharmacies, drugstores, and shopping centers. They're usually staffed by nurse practitioners or physician assistants that have been trained to treat common illnesses and complaints. They're usually fairly quick and inexpensive. However, if you have serious medical issues or chronic medical problems, these are probably not your best option. ° °No Primary Care Doctor: °- Call [facility] at  832-8000 - they can help you locate a primary care doctor that  accepts your insurance, provides certain services, etc. °- Physician Referral Service- 1-800-533-3463 ° °Chronic Pain Problems: °Organization         Address  Phone   Notes  °[facility]  (336) 297-2271 Patients need to be referred by their primary care doctor.  ° °Medication  Assistance: °Organization         Address  Phone   Notes  °[place] [facility] [address] °[place], [place] [address] (336) 641-8030 --Must be a resident of [place] °-- Must have NO insurance coverage whatsoever (no Medicaid/ Medicare, etc.) °-- The pt. MUST have a primary care doctor that directs their care regularly and follows them in the community °  °[facility]  (866) 331-1348   °[employer]  (888) 892-1162   ° °Agencies that provide inexpensive medical care: °Organization         Address  Phone   Notes  °[facility]  (336) 832-8035   °[facility]    (336) 832-7272   °[facility] [address] °[place], [place] [address] (336) 832-4777   °[facility] [place] [address], °[place] (336) 271-4999   °[facility]    (336) 373-0678   °[facility]    (336) 272-1050   °[facility] ° [address], [place] Phone:  (336) 832-4444, Fax:  (336) 832-4440 Hours of Operation:  9 am - 6 pm, M-F.  Also accepts Medicaid/Medicare and self-pay.  °[facility] ° [address], [place] Phone: (336) 832-3150, Fax: (336) 832-3151. Hours of Operation:  8:30 am - 5:30 pm, M-F.  Also accepts Medicaid and self-pay.  °[facility] [place] [address], [place] Phone: (336) 878-6027   °[facility] [address], [place], [place] (336)723-1848, Ext. 123 Mondays & Thursdays: 7-9 AM.  First 15 patients are seen on a first come, first serve basis. °  ° °Medicaid-accepting [place] Providers: ° °Organization         Address  Phone   Notes  °[facility] [address], [place] (336) 641-2100 Also accepts self-pay patients.  °[facility] [address], [place] ° (336) 856-9996   °[facility] [address], [place]  (336) 288-8857   °[facility] [address] [place] [address], [place] (336) 299-7000   °Veita Bland [address], [place]  ° (336) 373-1557 Only accepts [place] Access Medicaid patients after they have their name applied to their card.  ° °Self-Pay (no insurance) in [place]: ° °Organization         Address  Phone   Notes  °Sickle Cell Patients, [facility] [address], [place] (336) 832-1970   °[facility] [address], [place] (336) 832-4400   °[facility] ° [address], [place] (336) 992-4800   °[facility]/Dr. Osei-Bonsu ° [address] [place] [address], [place] or [address], [place] (336) 841-8500 Phone number for both [place] and [place] locations is the same.  °[facility] [address], [place] (336) 299-0000   °[facility] [address] [place] [address], [place] or [address] (336) 852-7530 °(336) 878-2260   °[facility] [address], [place] (336) 350-1642, phone; (336) 294-5005, fax Sees patients 1st and 3rd Saturday of every month.  Must not qualify for public or private insurance (i.e. Medicaid, Medicare, [place] Health Choice, Veterans' Benefits) • Household income should be no more than 200% of the poverty level •The clinic cannot treat you if you are pregnant or think you are pregnant • Sexually transmitted diseases are not treated at the clinic.  ° ° °Dental Care: °Organization         Address  Phone  Notes  °[place] [facility] [address], [place] (336) 641-6152 Accepts children up to age 21 who are enrolled in Medicaid or [place] Health Choice; pregnant women with a Medicaid card; and children who have applied for Medicaid or [place] Health Choice, but were declined, whose parents can pay a reduced fee at time of service.  °[place]  [facility] [place]  [address], [place] (336) 641-7733 Accepts children up to age 21 who are enrolled in Medicaid or [place] Health Choice; pregnant women with a Medicaid card; and children who have applied for Medicaid or [place] Health Choice, but were declined, whose parents can pay a reduced fee at time of service.  °[facility] ° [address], [place] (336) 641-4533 Patients are seen by appointment only. Walk-ins are not accepted. [facility] will see patients 18 years of age and older. °Monday - Tuesday (8am-5pm) °Most Wednesdays (8:30-5pm) °$30 per visit, cash only  °[facility] ° [address], [place] (336) 641-4533 Patients are seen by appointment only. Walk-ins are not accepted. [facility] will see patients 18 years of age and older. °One   Wednesday Evening (Monthly: Volunteer Based).  $30 per visit, cash only  °[facility]  (919) 537-3737 for adults; Children under age 4, call Graduate Pediatric Dentistry at (919) 537-3956. Children aged 4-14, please call (919) 537-3737 to request a pediatric application. ° Dental services are provided in all areas of dental care including fillings, crowns and bridges, complete and partial dentures, implants, gum treatment, root canals, and extractions. Preventive care is also provided. Treatment is provided to both adults and children. °Patients are selected via a lottery and there is often a waiting list. °  °[facility] [address], °[place] ° (336) 763-8833 [email or web address] °  °[facility] [address], [place], [place] (336)723-1848, Ext. 123 Second and Fourth Thursday of each month, opens at 6:30 AM; Clinic ends at 9 AM.  Patients are seen on a first-come first-served basis, and a limited number are seen during each clinic.  ° °[facility] ° [address], [place], [place] (336) 723-7904    Eligibility Requirements °You must have lived in [place], [place], or [place] counties for at least the last three months. °  You cannot be eligible for state or federal sponsored healthcare insurance, including Veterans Administration, Medicaid, or Medicare. °  You generally cannot be eligible for healthcare insurance through your employer.  °  How to apply: °Eligibility screenings are held every Tuesday and Wednesday afternoon from 1:00 pm until 4:00 pm. You do not need an appointment for the interview!  °[facility] [address], [place], [place] 336-631-2330   °[facility] [facility]  336-342-8273   °[facility]  336-703-3100   °[facility]  336-570-6415   ° °Behavioral Health Resources in the Community: °Intensive Outpatient Programs °Organization         Address  Phone  Notes  °[facility] [address], [place], [place] 336-878-6098   °[facility] [address], [place], [place] 336-832-9800   °[facility] [address], [place], [place] ° 336-882-2125   °[facility] [address],  °[place], [place] 1-800-853-5163 or 336-641-4981   °Substance Abuse Resources °Organization         Address  Phone  Notes  °[facility]  336-882-2125   °[facility]  336-784-9470   °[facility]  336-285-9073   °[facility]  336-845-3988   °Residential & Outpatient Substance Abuse Program  1-800-659-3381   °Psychological Services °Organization         Address  Phone  Notes  °[facility]  336- 832-9600   °[facility]  336- 378-7881   °[facility] [address], [place] 1-800-853-5163 or 336-641-4981   ° °Mobile Crisis Teams °Organization         Address  Phone  Notes  °[facility], Mobile Crisis Care Unit  1-877-626-1772   °Assertive °[facility] ° [address]  [place], [place] 336-834-9664   °Sharon DeEsch [address] °[place] 336-554-5454   ° °Self-Help/Support Groups °Organization         Address  Phone             Notes  °[facility]  - variety of support groups  336- 373-1402 Call for more information  °Narcotics Anonymous (NA), [facility] [address], °[place]  2 meetings at this location  ° °  Residential Treatment Programs °Organization         Address  Phone  Notes  °[facility] [address],    °[place]  1-866-801-8205   °[facility] ° [address], [place], [place] 704-293-8524   °[facility] [address], [place] 336-845-3988 Admissions: 8am-3pm M-F  °[facility] [address]    °[place], [place] 336-841-1104   °[facility] [address], [place], [place] 336-379-7146   °[facility] [address]  °[place], [place] 336-285-9073   °[facility] - Intensive Outpatient [address], [place], [place] 336-852-3033   °[facility] [address]  °[place], [place] 1-877-615-2722 or 336-784-9470   °[facility] [address] [place], [place] 336-227-7417 Accepts Medicaid  °[facility] [address]  ° [place] 1-800-659-3381 Substance Abuse/Addiction Treatment  ° °[place] Behavioral Health Resources °Organization         Address  Phone  Notes  °[facility]  (888) 581-9988   °Julie Brannon, PhD [address] [place], [place]   (336) 349-5553 or (336) 951-0000   °[facility]   [address] °[place], [place] (336) 349-4454   °[facility] [address], [place], [place] (336) 342-8316 Insurance/Medicaid/sponsorship through [facility]  °[facility] [address]                                    [place], [place] (336) 342-8316 Therapy/tele-psych/case    °[facility] [address]  ° [place], [place] (336) 349-2233    °Dr. Arfeen  (336) 349-4544   °[facility] [place]  [employer] [place] [facility] 1) [address] °2) [address], [place] °3)  [address], [place] (336) 349-3220 °(336) 342-7768 ° °(336) 342-8140   °[place] [facility] (336) 342-1394 or (336) 342-3537 (After Hours)    ° ° °

## 2015-05-09 NOTE — ED Notes (Addendum)
Pt over dosed on roxi and alcohol,  Pt's girlfriend gave him 0.4 Narcan in right hip prior to EMS arrival,  Apparently he has been using Heroin lately so she is prepared but he says no Heroin tonight only roxi's

## 2015-05-09 NOTE — ED Notes (Signed)
Pt states  He is feeling better and would like to go home

## 2017-01-14 ENCOUNTER — Encounter: Payer: Self-pay | Admitting: Emergency Medicine

## 2017-01-14 ENCOUNTER — Emergency Department: Payer: Self-pay

## 2017-01-14 ENCOUNTER — Observation Stay
Admission: EM | Admit: 2017-01-14 | Discharge: 2017-01-16 | Disposition: A | Discharge status: Home / Self Care | Payer: Self-pay | Attending: Internal Medicine | Admitting: Internal Medicine

## 2017-01-14 DIAGNOSIS — E876 Hypokalemia: Secondary | ICD-10-CM | POA: Insufficient documentation

## 2017-01-14 DIAGNOSIS — Z79899 Other long term (current) drug therapy: Secondary | ICD-10-CM | POA: Insufficient documentation

## 2017-01-14 DIAGNOSIS — D72829 Elevated white blood cell count, unspecified: Secondary | ICD-10-CM | POA: Insufficient documentation

## 2017-01-14 DIAGNOSIS — K8020 Calculus of gallbladder without cholecystitis without obstruction: Secondary | ICD-10-CM

## 2017-01-14 DIAGNOSIS — K529 Noninfective gastroenteritis and colitis, unspecified: Principal | ICD-10-CM | POA: Insufficient documentation

## 2017-01-14 DIAGNOSIS — R109 Unspecified abdominal pain: Secondary | ICD-10-CM

## 2017-01-14 DIAGNOSIS — N390 Urinary tract infection, site not specified: Secondary | ICD-10-CM | POA: Insufficient documentation

## 2017-01-14 DIAGNOSIS — F17210 Nicotine dependence, cigarettes, uncomplicated: Secondary | ICD-10-CM | POA: Insufficient documentation

## 2017-01-14 DIAGNOSIS — E860 Dehydration: Secondary | ICD-10-CM | POA: Diagnosis present

## 2017-01-14 DIAGNOSIS — R569 Unspecified convulsions: Secondary | ICD-10-CM | POA: Insufficient documentation

## 2017-01-14 DIAGNOSIS — A09 Infectious gastroenteritis and colitis, unspecified: Secondary | ICD-10-CM

## 2017-01-14 LAB — COMPREHENSIVE METABOLIC PANEL
ALK PHOS: 72 U/L (ref 38–126)
ALT: 60 U/L (ref 17–63)
AST: 88 U/L — AB (ref 15–41)
Albumin: 5.4 g/dL — ABNORMAL HIGH (ref 3.5–5.0)
Anion gap: 15 (ref 5–15)
BILIRUBIN TOTAL: 1.8 mg/dL — AB (ref 0.3–1.2)
BUN: 23 mg/dL — AB (ref 6–20)
CO2: 32 mmol/L (ref 22–32)
CREATININE: 1.2 mg/dL (ref 0.61–1.24)
Calcium: 10.2 mg/dL (ref 8.9–10.3)
Chloride: 90 mmol/L — ABNORMAL LOW (ref 101–111)
Glucose, Bld: 141 mg/dL — ABNORMAL HIGH (ref 65–99)
Potassium: 3.2 mmol/L — ABNORMAL LOW (ref 3.5–5.1)
Sodium: 137 mmol/L (ref 135–145)
Total Protein: 9.9 g/dL — ABNORMAL HIGH (ref 6.5–8.1)

## 2017-01-14 LAB — CBC
HCT: 54.7 % — ABNORMAL HIGH (ref 40.0–52.0)
Hemoglobin: 18.8 g/dL — ABNORMAL HIGH (ref 13.0–18.0)
MCH: 28.3 pg (ref 26.0–34.0)
MCHC: 34.4 g/dL (ref 32.0–36.0)
MCV: 82.2 fL (ref 80.0–100.0)
PLATELETS: 301 10*3/uL (ref 150–440)
RBC: 6.65 MIL/uL — AB (ref 4.40–5.90)
RDW: 14.2 % (ref 11.5–14.5)
WBC: 20.1 10*3/uL — AB (ref 3.8–10.6)

## 2017-01-14 LAB — URINALYSIS, COMPLETE (UACMP) WITH MICROSCOPIC
BILIRUBIN URINE: NEGATIVE
Bacteria, UA: NONE SEEN
Glucose, UA: NEGATIVE mg/dL
KETONES UR: 20 mg/dL — AB
LEUKOCYTES UA: NEGATIVE
Nitrite: NEGATIVE
PH: 6 (ref 5.0–8.0)
Protein, ur: 100 mg/dL — AB
SPECIFIC GRAVITY, URINE: 1.025 (ref 1.005–1.030)

## 2017-01-14 LAB — LIPASE, BLOOD: LIPASE: 24 U/L (ref 11–51)

## 2017-01-14 MED ORDER — ONDANSETRON 4 MG PO TBDP: 8.0000 mg | ORAL_TABLET | Freq: Once | ORAL | Status: DC

## 2017-01-14 MED ORDER — SODIUM CHLORIDE 0.9 % IV BOLUS (SEPSIS)
1000.0000 mL | Freq: Once | INTRAVENOUS | Status: AC
  Administered 2017-01-14: 1000 mL via INTRAVENOUS

## 2017-01-14 MED ORDER — ONDANSETRON HCL 4 MG/2ML IJ SOLN
4.0000 mg | Freq: Once | INTRAMUSCULAR | Status: AC
  Administered 2017-01-14: 4 mg via INTRAVENOUS
  Filled 2017-01-14: qty 2

## 2017-01-14 MED ORDER — SODIUM CHLORIDE 0.9 % IV SOLN
1000.0000 mg | Freq: Once | INTRAVENOUS | Status: AC
  Administered 2017-01-14: 1000 mg via INTRAVENOUS
  Filled 2017-01-14: qty 10

## 2017-01-14 NOTE — H&P (Signed)
History and Physical   [facility] [place] [facility] Admission History and Physical [employer] Sissi Padia, D.O.    Patient Name: Trevor Thornton MR#: [id number] Date of Birth: 03/08/1991 Date of Admission: 01/14/2017  Referring MD/NP/PA: Dr. Alphonzo LemmingsMcShane Primary Care Physician: System, Pcp Not In Patient coming from: Home  Chief Complaint:  Chief Complaint  Patient presents with  . Abdominal Pain  . Emesis    HPI: Trevor Thornton is a 26 y.o. male with a known history of Seizures and substance abuse presents to the emergency department for evaluation of nausea vomiting and diarrhea.  Patient was in a usual state of health until 3 days ago when he describes the onset of nausea vomiting abdominal pain and diarrhea he has had decreased by mouth intake and is not taking his medications secondary to nausea. He describes his pain as midepigastric, burning. He reports working in the hot sun several days ago.  Patient denies fevers/chills, weakness, dizziness, chest pain, shortness of breath, dysuria/frequency, changes in mental status.    Otherwise there has been no change in status. There has been no change in medications recently  No recent antibiotics.  There has been no recent illness, hospitalizations, travel or sick contacts.    EMS/ED Course: Patient received Keppra, Zofran, normal saline. Medical admission was requested for further management of dehydration secondary to gastroenteritis  Review of Systems:  CONSTITUTIONAL: No fever/chills, fatigue, weakness, weight gain/loss, headache. EYES: No blurry or double vision. ENT: No tinnitus, postnasal drip, redness or soreness of the oropharynx. RESPIRATORY: No cough, dyspnea, wheeze.  No hemoptysis.  CARDIOVASCULAR: No chest pain, palpitations, syncope, orthopnea. No lower extremity edema.  GASTROINTESTINAL: Positive nausea, vomiting, abdominal pain, diarrhea. No constipation.  No hematemesis, melena or hematochezia. GENITOURINARY: No dysuria,  frequency, hematuria. ENDOCRINE: No polyuria or nocturia. No heat or cold intolerance. HEMATOLOGY: No anemia, bruising, bleeding. INTEGUMENTARY: No rashes, ulcers, lesions. MUSCULOSKELETAL: No arthritis, gout, dyspnea. NEUROLOGIC: No numbness, tingling, ataxia, seizure-type activity, weakness. PSYCHIATRIC: No anxiety, depression, insomnia.   Past Medical History:  Diagnosis Date  . Seizures (HCC)   . Substance abuse     History reviewed. No pertinent surgical history.   reports that he has been smoking Cigarettes.  He has been smoking about 6.00 packs per day. He does not have any smokeless tobacco history on file. He reports that he drinks alcohol. He reports that he does not use drugs.  No Known Allergies  History reviewed. No pertinent family history.  Prior to Admission medications   Medication Sig Start Date End Date Taking? Authorizing Provider  albuterol (PROVENTIL HFA;VENTOLIN HFA) 108 (90 BASE) MCG/ACT inhaler Inhale 1-2 puffs into the lungs every 6 (six) hours as needed for wheezing or shortness of breath.   Yes [provider]  Cyanocobalamin (VITAMIN B-12 PO) Take 1 tablet by mouth daily.   Yes [provider]  Lacosamide (VIMPAT) 100 MG TABS Take 100 mg by mouth daily.    Yes [provider]  levETIRAcetam (KEPPRA) 500 MG tablet Take 500 mg by mouth daily.   Yes [provider]  Cholecalciferol (VITAMIN D PO) Take 1 tablet by mouth daily.    [provider]    Physical Exam: Vitals:   01/14/17 1900 01/14/17 1915 01/14/17 1930 01/14/17 1939  BP: (!) 109/58  108/64 108/64  Pulse: 91 92  92  Resp:    16  Temp:      TempSrc:      SpO2: 98% 100%  98%  Weight:  Height:        GENERAL: 26 y.o.-year-old male patient, well-developed, well-nourished lying in the bed in no acute distress.  Pleasant and cooperative.   HEENT: Head atraumatic, normocephalic. Pupils equal, round, reactive to light and accommodation. No  scleral icterus. Extraocular muscles intact. Nares are patent. Oropharynx is clear. Mucus membranes Dry NECK: Supple, full range of motion. No JVD, no bruit heard. No thyroid enlargement, no tenderness, no cervical lymphadenopathy. CHEST: Normal breath sounds bilaterally. No wheezing, rales, rhonchi or crackles. No use of accessory muscles of respiration.  No reproducible chest wall tenderness.  CARDIOVASCULAR: S1, S2 normal. No murmurs, rubs, or gallops. Cap refill <2 seconds. Pulses intact distally.  ABDOMEN: Soft, midepigastric tenderness to palpation.  No rebound, guarding, rigidity. Normoactive bowel sounds present in all four quadrants. No organomegaly or mass. EXTREMITIES: No pedal edema, cyanosis, or clubbing. No calf tenderness or Homan's sign.  NEUROLOGIC: The patient is alert and oriented x 3. Cranial nerves II through XII are grossly intact with no focal sensorimotor deficit. Muscle strength 5/5 in all extremities. Sensation intact. Gait not checked. PSYCHIATRIC:  Normal affect, mood, thought content. SKIN: Warm, dry, and intact without obvious rash, lesion, or ulcer.    Labs on Admission:  CBC:  Recent Labs Lab 01/14/17 1646  WBC 20.1*  HGB 18.8*  HCT 54.7*  MCV 82.2  PLT 301   Basic Metabolic Panel:  Recent Labs Lab 01/14/17 1646  NA 137  K 3.2*  CL 90*  CO2 32  GLUCOSE 141*  BUN 23*  CREATININE 1.20  CALCIUM 10.2   GFR: Estimated Creatinine Clearance: 87.6 mL/min (by C-G formula based on SCr of 1.2 mg/dL). Liver Function Tests:  Recent Labs Lab 01/14/17 1646  AST 88*  ALT 60  ALKPHOS 72  BILITOT 1.8*  PROT 9.9*  ALBUMIN 5.4*    Recent Labs Lab 01/14/17 1646  LIPASE 24   No results for input(s): AMMONIA in the last 168 hours. Coagulation Profile: No results for input(s): INR, PROTIME in the last 168 hours. Cardiac Enzymes: No results for input(s): CKTOTAL, CKMB, CKMBINDEX, TROPONINI in the last 168 hours. BNP (last 3 results) No results  for input(s): PROBNP in the last 8760 hours. HbA1C: No results for input(s): HGBA1C in the last 72 hours. CBG: No results for input(s): GLUCAP in the last 168 hours. Lipid Profile: No results for input(s): CHOL, HDL, LDLCALC, TRIG, CHOLHDL, LDLDIRECT in the last 72 hours. Thyroid Function Tests: No results for input(s): TSH, T4TOTAL, FREET4, T3FREE, THYROIDAB in the last 72 hours. Anemia Panel: No results for input(s): VITAMINB12, FOLATE, FERRITIN, TIBC, IRON, RETICCTPCT in the last 72 hours. Urine analysis:    Component Value Date/Time   COLORURINE AMBER (A) 01/14/2017 1646   APPEARANCEUR CLEAR (A) 01/14/2017 1646   APPEARANCEUR Cloudy 03/02/2013 1326   LABSPEC 1.025 01/14/2017 1646   LABSPEC 1.030 03/02/2013 1326   PHURINE 6.0 01/14/2017 1646   GLUCOSEU NEGATIVE 01/14/2017 1646   GLUCOSEU Negative 03/02/2013 1326   HGBUR MODERATE (A) 01/14/2017 1646   BILIRUBINUR NEGATIVE 01/14/2017 1646   BILIRUBINUR 2+ 03/02/2013 1326   KETONESUR 20 (A) 01/14/2017 1646   PROTEINUR 100 (A) 01/14/2017 1646   NITRITE NEGATIVE 01/14/2017 1646   LEUKOCYTESUR NEGATIVE 01/14/2017 1646   LEUKOCYTESUR Negative 03/02/2013 1326   Sepsis Labs: @LABRCNTIP (procalcitonin:4,lacticidven:4) )No results found for this or any previous visit (from the past 240 hour(s)).   Radiological Exams on Admission: US Abdomen Limited Ruq  Result Date: 01/14/2017 CLINICAL DATA:  26 y/o  M; epigastric pain, nausea, vomiting, and diarrhea for 2 days. EXAM: ULTRASOUND ABDOMEN LIMITED RIGHT UPPER QUADRANT COMPARISON:  None. FINDINGS: Gallbladder: No gallbladder wall thickening or pericholecystic fluid. Positive sonographic Murphy sign. Gallbladder stones measuring up to 9 mm and gallbladder sludge. Common bile duct: Diameter: 4.8 mm Liver: No focal lesion identified. Within normal limits in parenchymal echogenicity. IMPRESSION: Gallstones and positive sonographic Murphy sign may represent acute cholecystitis in the appropriate  clinical setting. No gallbladder wall thickening, pericholecystic fluid, or common bile duct dilatation. Electronically Signed   By: Mitzi Hansen M.D.   On: 01/14/2017 20:43    EKG: Sinus tach at 122 bpm with normal axis and nonspecific ST-T wave changes.   Assessment/Plan  This is a 26 y.o. male with a history of seizures, substance abuse now being admitted with:  #. Dehydration secondary to gastroenteritis -Admit to observation -Continue IV fluid hydration -Clear liquid diet advance as tolerated -Antiemetics and antidiarrheal agents as needed  #. Abdominal pain with gallstones, no evidence of acute cholecystitis -Surgical input appreciated -Pain control  #. Leukocytosis -Follow-up cultures  #. History of seizures -Continue Keppra and Vimpat IV   Admission status: Observation IV Fluids: Normal saline Diet/Nutrition: Clear liquids, advance as tolerated Consults called: None  DVT Px: Lovenox, SCDs and early ambulation. Code Status: Full Code  Disposition Plan: To home in less than 24 hours  All the records are reviewed and case discussed with ED provider. Management plans discussed with the patient and/or family who express understanding and agree with plan of care.  Kery Haltiwanger D.O. on 01/14/2017 at 10:10 PM Between 7am to 6pm - Pager - [phone] After 6pm go to [email or web address] - password [email or web address] [facility] [place] Hospitalists Office [phone] CC: Primary care physician; System, Pcp Not In   01/14/2017, 10:10 PM

## 2017-01-14 NOTE — ED Notes (Signed)
Unable to print lab stickers as the account was locked, called lab unable to unlock account. Tried another computer and still did not work. Informed lab, patient's urine and blood would be sent with chart labels.

## 2017-01-14 NOTE — Consult Note (Signed)
Patient ID: Trevor Thornton, male   DOB: [birth year], 26 y.o.   MRN: [id number]  CC: Nausea and vomiting  HPI Trevor Thornton is a 26 y.o. male presents emergency department for evaluation of nausea, vomiting, diarrhea, abdominal pain. Patient reports that he's been having intermittent nausea and vomiting for many months but states that for the last 3 days he's been unable to keep anything down. He's also been having loose runny stools over the same time he reports that he is also having upper abdominal pain. Unable to state which started first. Patient reports she's had fevers and chills over this time period. He also states he's had intermittent shortness of breath. He has a known history of seizures and states he's been unable to take his seizure medication for the last several days. He reports his emesis has started to look like [facility] per him. He denies any recent travel or sick contacts. He was brought to the emergency room today after his mother remarked that he threw up all night the night before.  HPI  Past Medical History:  Diagnosis Date  . Seizures (HCC)   . Substance abuse     Surgical history: No prior abdominal surgeries.  Family history: No noted history of cancers, heart disease, diabetes.  Social History Social History  Substance Use Topics  . Smoking status: Current Every Day Smoker    Packs/day: 6.00    Types: Cigarettes  . Smokeless tobacco: Not on file  . Alcohol use Yes    No Known Allergies  No current facility-administered medications for this encounter.    Current Outpatient Prescriptions  Medication Sig Dispense Refill  . albuterol (PROVENTIL HFA;VENTOLIN HFA) 108 (90 BASE) MCG/ACT inhaler Inhale 1-2 puffs into the lungs every 6 (six) hours as needed for wheezing or shortness of breath.    . Cyanocobalamin (VITAMIN B-12 PO) Take 1 tablet by mouth daily.    . Lacosamide (VIMPAT) 100 MG TABS Take 100 mg by mouth daily.     Marland Kitchen levETIRAcetam (KEPPRA) 500 MG  tablet Take 500 mg by mouth daily.    . Cholecalciferol (VITAMIN D PO) Take 1 tablet by mouth daily.       Review of Systems A Multi-point review of systems was asked and was negative except for the findings dissected in the history of present illness  Physical Exam Blood pressure 108/64, pulse 92, temperature 99.3 F (37.4 C), temperature source Oral, resp. rate 16, height 5\' 10"  (1.778 m), weight 65.8 kg (145 lb), SpO2 98 %. CONSTITUTIONAL: Resting in bed in no acute distress. EYES: Pupils are equal, round, and reactive to light, Sclera are non-icteric. EARS, NOSE, MOUTH AND THROAT: The oropharynx is clear. The oral mucosa is pink and moist. Hearing is intact to voice. LYMPH NODES:  Lymph nodes in the neck are normal. RESPIRATORY:  Lungs are clear. There is normal respiratory effort, with equal breath sounds bilaterally, and without pathologic use of accessory muscles. CARDIOVASCULAR: Heart is regular without murmurs, gallops, or rubs. GI: The abdomen is soft, mildly tender to deep palpation in all quadrants, mildly different in the right upper quadrant with a negative Murphy sign, and nondistended. There are no palpable masses. There is no hepatosplenomegaly. There are normal bowel sounds in all quadrants. GU: Rectal deferred.   MUSCULOSKELETAL: Normal muscle strength and tone. No cyanosis or edema.   SKIN: Turgor is good and there are no pathologic skin lesions or ulcers. NEUROLOGIC: Motor and sensation is grossly normal. Cranial nerves  are grossly intact. PSYCH:  Oriented to person, place and time. Affect is normal.  Data Reviewed Images and labs reviewed. Labs showed leukocytosis of 20.1, mild hypokalemia of 3.2, hypochloremia of 90, mild elevation of bilirubin to 1.8. Right upper quadrant ultrasound shows cholelithiasis but no evidence of ductal dilatation, pericholecystic fluid, wall thickening. I have personally reviewed the patient's imaging, laboratory findings and medical  records.    Assessment    Nausea, vomiting, abdominal pain    Plan    26 year old male likely with gastroenteritis and cholelithiasis. Obvious signs of dehydration in the ER and per report the patient has not been taking his antiseizure medications due to the nausea and vomiting. History and exam are most consistent with gastritis. Recommended patient, the hospital under the hospitalist team for hydration and restarting his seizure medications. General surgery services will be available and we'll follow along to ensure that his gallbladder is not playing a portion of his current illness. However, no plans for urgent surgical intervention at this time.     Time spent with the patient was 50 minutes, with more than 50% of the time spent in face-to-face education, counseling and care coordination.     Ricarda Frameharles Lucia Mccreadie, MD FACS General Surgeon 01/14/2017, 10:27 PM

## 2017-01-14 NOTE — ED Provider Notes (Signed)
[facility] Emergency Department Provider Note  ____________________________________________   I have reviewed the triage vital signs and the nursing notes.   HISTORY  Chief Complaint Abdominal Pain and Emesis    HPI Janene MadeiraClyde C Greth is a 26 y.o. male Presents here with nausea vomiting diarrhea for 2 days. Multiple episodes of diarrhea, feels "worn out", also innumerable episodes of vomiting. Denies fever or chills. Has some epigastric abdominal pain.mostly from vomiting. Denies any melena bright red blood per rectum or hematemesis. States that his emesis is sometimes is green. Nothing makes this better nothing makes it worse.  Past Medical History:  Diagnosis Date  . Seizures (HCC)   . Substance abuse     There are no active problems to display for this patient.   History reviewed. No pertinent surgical history.  Prior to Admission medications   Medication Sig Start Date End Date Taking? Authorizing Provider  albuterol (PROVENTIL HFA;VENTOLIN HFA) 108 (90 BASE) MCG/ACT inhaler Inhale 1-2 puffs into the lungs every 6 (six) hours as needed for wheezing or shortness of breath.    [provider]  Cholecalciferol (VITAMIN D PO) Take 1 tablet by mouth daily.    [provider]  Cyanocobalamin (VITAMIN B-12 PO) Take 1 tablet by mouth daily.    [provider]  Lacosamide (VIMPAT) 100 MG TABS Take 100 mg by mouth 2 (two) times daily.    [provider]  levETIRAcetam (KEPPRA) 500 MG tablet Take 500 mg by mouth daily.    [provider]    Allergies Patient has no known allergies.  History reviewed. No pertinent family history.  Social History Social History  Substance Use Topics  . Smoking status: Current Every Day Smoker    Packs/day: 6.00    Types: Cigarettes  . Smokeless tobacco: Not on file  . Alcohol use Yes    Review of Systems Constitutional: No fever/chills Eyes: No visual changes. ENT: No sore  throat. No stiff neck no neck pain Cardiovascular: Denies chest pain. Respiratory: Denies shortness of breath. Gastrointestinal:   History of present illness Genitourinary: Negative for dysuria. Musculoskeletal: Negative lower extremity swelling Skin: Negative for rash. Neurological: Negative for severe headaches, focal weakness or numbness.   ____________________________________________   PHYSICAL EXAM:  VITAL SIGNS: ED Triage Vitals  Enc Vitals Group     BP 01/14/17 1642 (!) 144/84     Pulse Rate 01/14/17 1642 (!) 125     Resp 01/14/17 1642 16     Temp 01/14/17 1642 99.3 F (37.4 C)     Temp Source 01/14/17 1642 Oral     SpO2 01/14/17 1642 97 %     Weight 01/14/17 1643 145 lb (65.8 kg)     Height 01/14/17 1643 5\' 10"  (1.778 m)     Head Circumference --      Peak Flow --      Pain Score 01/14/17 1645 10     Pain Loc --      Pain Edu? --      Excl. in GC? --     Constitutional: Alert and oriented. Well appearing and in no acute distress. Eyes: Conjunctivae are normal Head: Atraumatic HEENT: No congestion/rhinnorhea. Mucous membranes are dry.  Oropharynx non-erythematous Neck:   Nontender with no meningismus, no masses, no stridor Cardiovascular: Normal rate, regular rhythm. Grossly normal heart sounds.  Good peripheral circulation. Respiratory: Normal respiratory effort.  No retractions. Lungs CTAB. Abdominal: Soft and Positive epigastric discomfort. No distention. No guarding no  rebound Back:  There is no focal tenderness or step off.  there is no midline tenderness there are no lesions noted. there is no CVA tenderness Musculoskeletal: No lower extremity tenderness, no upper extremity tenderness. No joint effusions, no DVT signs strong distal pulses no edema Neurologic:  Normal speech and language. No gross focal neurologic deficits are appreciated.  Skin:  Skin is warm, dry and intact. No rash noted. Psychiatric: Mood and affect are normal. Speech and behavior are  normal.  ____________________________________________   LABS (all labs ordered are listed, but only abnormal results are displayed)  Labs Reviewed  COMPREHENSIVE METABOLIC PANEL - Abnormal; Notable for the following:       Result Value   Potassium 3.2 (*)    Chloride 90 (*)    Glucose, Bld 141 (*)    BUN 23 (*)    Total Protein 9.9 (*)    Albumin 5.4 (*)    AST 88 (*)    Total Bilirubin 1.8 (*)    All other components within normal limits  CBC - Abnormal; Notable for the following:    WBC 20.1 (*)    RBC 6.65 (*)    Hemoglobin 18.8 (*)    HCT 54.7 (*)    All other components within normal limits  URINALYSIS, COMPLETE (UACMP) WITH MICROSCOPIC - Abnormal; Notable for the following:    Color, Urine AMBER (*)    APPearance CLEAR (*)    Hgb urine dipstick MODERATE (*)    Ketones, ur 20 (*)    Protein, ur 100 (*)    Squamous Epithelial / LPF 0-5 (*)    All other components within normal limits  LIPASE, BLOOD   ____________________________________________  EKG  I personally interpreted any EKGs ordered by me or triage  sinus rhythm rate 122 bpm, tachycardia noted. No acute ST elevation or depression, normal axis ____________________________________________  RADIOLOGY  I reviewed any imaging ordered by me or triage that were performed during my shift and, if possible, patient and/or family made aware of any abnormal findings. ____________________________________________   PROCEDURES  Procedure(s) performed: None  Procedures  Critical Care performed: None  ____________________________________________   INITIAL IMPRESSION / ASSESSMENT AND PLAN / ED COURSE  Pertinent labs & imaging results that were available during my care of the patient were reviewed by me and considered in my medical decision making (see chart for details).  Patient here with nausea vomiting diarrhea. Very large community burden of same. Bilirubin is somewhat elevated. Will send him for an  ultrasound, he does have some minimal epigastric abdominal pain. She only could be his gallbladder but I favor more likely dehydration and vomiting. He has some mild hemolysis also. We have given him IV fluids and he feels 100% better. White count is elevated but also his hemoglobin and hematocrit are also elevated suggestive of a significant contraction. We are giving him 2 L of fluid. He has not taken his Keppra recently we will load him with Keppra. We will continue to observe in the emergency room.  ----------------------------------------- 7:28 PM on 01/14/2017 -----------------------------------------  After 1 L of fluid patient states he feels 100% better. Abdomen is benign. We will continue with planned but he looks much better.    ____________________________________________   FINAL CLINICAL IMPRESSION(S) / ED DIAGNOSES  Final diagnoses:  None      This chart was dictated using voice recognition software.  Despite best efforts to proofread,  errors can occur which can change meaning.  Jeanmarie Plant, MD 01/14/17 [phone]

## 2017-01-14 NOTE — ED Triage Notes (Signed)
Pt reports decreased appetite and unable to keep anything down. Pt has hx of seizures. Pt states he has been out working in the heat recently.  Pt throwing up green bile. Unable to keep seizure medications down. Pt taking Keppra and Vimpat.  Last seizure was 6 months ago.   Pt c/o central abdominal pain.

## 2017-01-15 ENCOUNTER — Observation Stay: Payer: Self-pay

## 2017-01-15 ENCOUNTER — Encounter: Payer: Self-pay | Admitting: Nurse Practitioner

## 2017-01-15 DIAGNOSIS — R109 Unspecified abdominal pain: Secondary | ICD-10-CM

## 2017-01-15 DIAGNOSIS — K8020 Calculus of gallbladder without cholecystitis without obstruction: Secondary | ICD-10-CM

## 2017-01-15 LAB — URINE DRUG SCREEN, QUALITATIVE (ARMC ONLY)
AMPHETAMINES, UR SCREEN: NOT DETECTED
BARBITURATES, UR SCREEN: NOT DETECTED
Benzodiazepine, Ur Scrn: NOT DETECTED
COCAINE METABOLITE, UR ~~LOC~~: NOT DETECTED
Cannabinoid 50 Ng, Ur ~~LOC~~: POSITIVE — AB
MDMA (ECSTASY) UR SCREEN: NOT DETECTED
METHADONE SCREEN, URINE: NOT DETECTED
Opiate, Ur Screen: POSITIVE — AB
Phencyclidine (PCP) Ur S: NOT DETECTED
TRICYCLIC, UR SCREEN: NOT DETECTED

## 2017-01-15 LAB — BASIC METABOLIC PANEL
ANION GAP: 9 (ref 5–15)
BUN: 19 mg/dL (ref 6–20)
CALCIUM: 8.5 mg/dL — AB (ref 8.9–10.3)
CO2: 27 mmol/L (ref 22–32)
Chloride: 101 mmol/L (ref 101–111)
Creatinine, Ser: 0.98 mg/dL (ref 0.61–1.24)
GFR calc non Af Amer: >60 mL/min (ref >60)
Glucose, Bld: 105 mg/dL — ABNORMAL HIGH (ref 65–99)
POTASSIUM: 3.4 mmol/L — AB (ref 3.5–5.1)
Sodium: 137 mmol/L (ref 135–145)

## 2017-01-15 LAB — CBC
HCT: 43.4 % (ref 40.0–52.0)
HEMOGLOBIN: 14.7 g/dL (ref 13.0–18.0)
MCH: 28.3 pg (ref 26.0–34.0)
MCHC: 33.9 g/dL (ref 32.0–36.0)
MCV: 83.5 fL (ref 80.0–100.0)
Platelets: 244 10*3/uL (ref 150–440)
RBC: 5.2 MIL/uL (ref 4.40–5.90)
RDW: 14 % (ref 11.5–14.5)
WBC: 14.5 10*3/uL — ABNORMAL HIGH (ref 3.8–10.6)

## 2017-01-15 LAB — MAGNESIUM: MAGNESIUM: 1.6 mg/dL — AB (ref 1.7–2.4)

## 2017-01-15 MED ORDER — DIPHENHYDRAMINE HCL 25 MG PO CAPS: 25.0000 mg | ORAL_CAPSULE | Freq: Once | ORAL | Status: AC

## 2017-01-15 MED ORDER — ALBUTEROL SULFATE (2.5 MG/3ML) 0.083% IN NEBU: 2.5000 mg | INHALATION_SOLUTION | Freq: Four times a day (QID) | RESPIRATORY_TRACT | Status: DC | PRN

## 2017-01-15 MED ORDER — ONDANSETRON HCL 4 MG/2ML IJ SOLN
4.0000 mg | Freq: Four times a day (QID) | INTRAMUSCULAR | Status: DC | PRN
  Administered 2017-01-15 – 2017-01-16 (×2): 4 mg via INTRAVENOUS
  Filled 2017-01-15 (×2): qty 2

## 2017-01-15 MED ORDER — ZOLPIDEM TARTRATE 5 MG PO TABS
5.0000 mg | ORAL_TABLET | Freq: Every evening | ORAL | Status: DC | PRN
  Administered 2017-01-15: 5 mg via ORAL
  Filled 2017-01-15: qty 1

## 2017-01-15 MED ORDER — IPRATROPIUM BROMIDE 0.02 % IN SOLN: 0.5000 mg | Freq: Four times a day (QID) | RESPIRATORY_TRACT | Status: DC | PRN

## 2017-01-15 MED ORDER — POTASSIUM CHLORIDE 10 MEQ/100ML IV SOLN
10.0000 meq | INTRAVENOUS | Status: AC
  Administered 2017-01-15 (×3): 10 meq via INTRAVENOUS
  Filled 2017-01-15 (×3): qty 100

## 2017-01-15 MED ORDER — DIPHENHYDRAMINE HCL 25 MG PO CAPS
ORAL_CAPSULE | ORAL | Status: AC
  Administered 2017-01-15: 25 mg
  Filled 2017-01-15: qty 1

## 2017-01-15 MED ORDER — MAGNESIUM SULFATE 2 GM/50ML IV SOLN
2.0000 g | Freq: Once | INTRAVENOUS | Status: AC
Start: 2017-01-15 — End: 2017-01-15
  Administered 2017-01-15: 2 g via INTRAVENOUS
  Filled 2017-01-15: qty 50

## 2017-01-15 MED ORDER — LEVETIRACETAM 500 MG PO TABS
500.0000 mg | ORAL_TABLET | Freq: Two times a day (BID) | ORAL | Status: DC
  Administered 2017-01-15 – 2017-01-16 (×2): 500 mg via ORAL
  Filled 2017-01-15 (×2): qty 1

## 2017-01-15 MED ORDER — MORPHINE SULFATE (PF) 2 MG/ML IV SOLN
INTRAVENOUS | Status: AC
  Administered 2017-01-15: 2 mg via INTRAVENOUS
  Filled 2017-01-15: qty 1

## 2017-01-15 MED ORDER — SODIUM CHLORIDE 0.9 % IV SOLN
100.0000 mg | INTRAVENOUS | Status: DC
  Administered 2017-01-15: 100 mg via INTRAVENOUS
  Filled 2017-01-15 (×2): qty 10

## 2017-01-15 MED ORDER — POTASSIUM CHLORIDE 20 MEQ PO PACK
40.0000 meq | PACK | Freq: Once | ORAL | Status: AC
  Administered 2017-01-15: 40 meq via ORAL
  Filled 2017-01-15: qty 2

## 2017-01-15 MED ORDER — DIPHENOXYLATE-ATROPINE 2.5-0.025 MG PO TABS: 1.0000 | ORAL_TABLET | Freq: Four times a day (QID) | ORAL | Status: DC | PRN

## 2017-01-15 MED ORDER — ACETAMINOPHEN 650 MG RE SUPP: 650.0000 mg | Freq: Four times a day (QID) | RECTAL | Status: DC | PRN

## 2017-01-15 MED ORDER — ONDANSETRON HCL 4 MG PO TABS: 4.0000 mg | ORAL_TABLET | Freq: Four times a day (QID) | ORAL | Status: DC | PRN

## 2017-01-15 MED ORDER — LACOSAMIDE 50 MG PO TABS
100.0000 mg | ORAL_TABLET | Freq: Every day | ORAL | Status: DC
  Administered 2017-01-15: 100 mg via ORAL
  Filled 2017-01-15: qty 2

## 2017-01-15 MED ORDER — OXYCODONE HCL 5 MG PO TABS: 5.0000 mg | ORAL_TABLET | Freq: Four times a day (QID) | ORAL | Status: DC | PRN

## 2017-01-15 MED ORDER — ACETAMINOPHEN 325 MG PO TABS: 650.0000 mg | ORAL_TABLET | Freq: Four times a day (QID) | ORAL | Status: DC | PRN

## 2017-01-15 MED ORDER — IOPAMIDOL (ISOVUE-300) INJECTION 61%
100.0000 mL | Freq: Once | INTRAVENOUS | Status: AC | PRN
  Administered 2017-01-15: 100 mL via INTRAVENOUS

## 2017-01-15 MED ORDER — SODIUM CHLORIDE 0.9 % IV SOLN
500.0000 mg | Freq: Two times a day (BID) | INTRAVENOUS | Status: DC
  Administered 2017-01-15: 500 mg via INTRAVENOUS
  Filled 2017-01-15 (×2): qty 5

## 2017-01-15 MED ORDER — DEXTROSE 5 % IV SOLN
1.0000 g | INTRAVENOUS | Status: DC
  Administered 2017-01-15: 1 g via INTRAVENOUS
  Filled 2017-01-15 (×2): qty 10

## 2017-01-15 MED ORDER — IOPAMIDOL (ISOVUE-300) INJECTION 61%
15.0000 mL | INTRAVENOUS | Status: AC
  Administered 2017-01-15: 15 mL via ORAL
  Administered 2017-01-15: 30 mL via ORAL

## 2017-01-15 MED ORDER — MORPHINE SULFATE (PF) 2 MG/ML IV SOLN
1.0000 mg | INTRAVENOUS | Status: DC | PRN
  Administered 2017-01-15: 1 mg via INTRAVENOUS
  Filled 2017-01-15: qty 1

## 2017-01-15 MED ORDER — DIPHENOXYLATE-ATROPINE 2.5-0.025 MG/5ML PO LIQD: 5.0000 mL | Freq: Four times a day (QID) | ORAL | Status: DC | PRN

## 2017-01-15 MED ORDER — SODIUM CHLORIDE 0.9 % IV SOLN
INTRAVENOUS | Status: DC
  Administered 2017-01-15 – 2017-01-16 (×4): via INTRAVENOUS

## 2017-01-15 NOTE — Care Management Obs Status (Signed)
MEDICARE OBSERVATION STATUS NOTIFICATION   Patient Details  Name: Trevor MadeiraClyde C Ferraiolo MRN: [id number] Date of Birth: 05/01/1991   Medicare Observation Status Notification Given:  No [facility]([place] letter)  Not given. No medicare.     Jeremiah Curci A, RN 01/15/2017, 12:42 PM

## 2017-01-15 NOTE — Progress Notes (Addendum)
[facility] - [place] at [facility]   PATIENT NAME: Trevor Thornton    MR#:  [id number]  DATE OF BIRTH:  09/01/1990  SUBJECTIVE:  CHIEF COMPLAINT:   Chief Complaint  Patient presents with  . Abdominal Pain  . Emesis   still Abdominal pain and nausea but no vomiting or diarrhea. Has dysuria REVIEW OF SYSTEMS:  Review of Systems  Constitutional: Positive for malaise/fatigue. Negative for chills and fever.  HENT: Negative for sore throat.   Eyes: Negative for blurred vision and double vision.  Respiratory: Negative for cough, shortness of breath, wheezing and stridor.   Cardiovascular: Negative for chest pain and leg swelling.  Gastrointestinal: Positive for nausea. Negative for abdominal pain, blood in stool, diarrhea, melena and vomiting.  Genitourinary: Positive for dysuria. Negative for flank pain and hematuria.  Musculoskeletal: Negative for back pain.  Skin: Negative for itching and rash.  Neurological: Positive for weakness. Negative for dizziness, focal weakness, seizures, loss of consciousness and headaches.  Psychiatric/Behavioral: Negative for depression. The patient is not nervous/anxious.     DRUG ALLERGIES:  No Known Allergies VITALS:  Blood pressure 102/65, pulse 78, temperature 98.7 F (37.1 C), temperature source Oral, resp. rate 20, height 5\' 10"  (1.778 m), weight 141 lb 4.8 oz (64.1 kg), SpO2 99 %. PHYSICAL EXAMINATION:  Physical Exam  Constitutional: He is oriented to person, place, and time and well-developed, well-nourished, and in no distress.  HENT:  Head: Normocephalic.  Mouth/Throat: Oropharynx is clear and moist.  Eyes: Conjunctivae and EOM are normal. No scleral icterus.  Neck: Normal range of motion. Neck supple. No JVD present. No tracheal deviation present.  Cardiovascular: Normal rate, regular rhythm and normal heart sounds.  Exam reveals no gallop.   No murmur heard. Pulmonary/Chest: Effort normal and breath sounds normal. No  respiratory distress. He has no wheezes. He has no rales.  Abdominal: Soft. Bowel sounds are normal. He exhibits no distension. There is tenderness. There is no rebound and no guarding.  Diffuse tenderness  Musculoskeletal: Normal range of motion. He exhibits no edema or tenderness.  Neurological: He is alert and oriented to person, place, and time. No cranial nerve deficit.  Skin: No rash noted. No erythema.  Psychiatric: Affect normal.   LABORATORY PANEL:  Male CBC  Recent Labs Lab 01/15/17 0359  WBC 14.5*  HGB 14.7  HCT 43.4  PLT 244   ------------------------------------------------------------------------------------------------------------------ Chemistries   Recent Labs Lab 01/14/17 1646 01/15/17 0359  NA 137 137  K 3.2* 3.4*  CL 90* 101  CO2 32 27  GLUCOSE 141* 105*  BUN 23* 19  CREATININE 1.20 0.98  CALCIUM 10.2 8.5*  MG  --  1.6*  AST 88*  --   ALT 60  --   ALKPHOS 72  --   BILITOT 1.8*  --    RADIOLOGY:  Ct Abdomen Pelvis W Contrast  Result Date: 01/15/2017 CLINICAL DATA:  Patient with nausea, vomiting, diarrhea and abdominal pain. EXAM: CT ABDOMEN AND PELVIS WITH CONTRAST TECHNIQUE: Multidetector CT imaging of the abdomen and pelvis was performed using the standard protocol following bolus administration of intravenous contrast. CONTRAST:  100mL ISOVUE-300 IOPAMIDOL (ISOVUE-300) INJECTION 61% COMPARISON:  Ultrasound right upper quadrant 01/14/2017. FINDINGS: Lower chest: Normal heart size. Dependent atelectasis within the bilateral lower lobes. No pleural effusion. Hepatobiliary: Liver is normal in size and contour. No focal lesion identified. Gallbladder is unremarkable. Pancreas: Unremarkable Spleen: Unremarkable Adrenals/Urinary Tract: The adrenal glands are normal. Kidneys enhance symmetrically with contrast. No  hydronephrosis. Left extrarenal pelvis. Urinary bladder is unremarkable. Stomach/Bowel: Descending colon is decompressed, limiting evaluation. No  evidence for bowel obstruction. Normal appendix. Normal morphology of the stomach. No free fluid or free intraperitoneal air. Vascular/Lymphatic: Normal caliber abdominal aorta. No retroperitoneal lymphadenopathy. Reproductive: Prostate unremarkable. Other: None. Musculoskeletal: No aggressive or acute appearing osseous lesions. IMPRESSION: No acute process within the abdomen or pelvis. Electronically Signed   By: Annia Belt M.D.   On: 01/15/2017 11:28   US Abdomen Limited Ruq  Result Date: 01/14/2017 CLINICAL DATA:  26 y/o M; epigastric pain, nausea, vomiting, and diarrhea for 2 days. EXAM: ULTRASOUND ABDOMEN LIMITED RIGHT UPPER QUADRANT COMPARISON:  None. FINDINGS: Gallbladder: No gallbladder wall thickening or pericholecystic fluid. Positive sonographic Murphy sign. Gallbladder stones measuring up to 9 mm and gallbladder sludge. Common bile duct: Diameter: 4.8 mm Liver: No focal lesion identified. Within normal limits in parenchymal echogenicity. IMPRESSION: Gallstones and positive sonographic Murphy sign may represent acute cholecystitis in the appropriate clinical setting. No gallbladder wall thickening, pericholecystic fluid, or common bile duct dilatation. Electronically Signed   By: Mitzi Hansen M.D.   On: 01/14/2017 20:43   ASSESSMENT AND PLAN:   This is a 26 y.o. male with a history of seizures, substance abuse now being admitted with:  #. Dehydration secondary to gastroenteritis -Continue IV fluid hydration -Clear liquid diet advance as tolerated -Antiemetics and antidiarrheal agents as needed  #. Abdominal pain with gallstones, no evidence of acute cholecystitis -Surgical input appreciated, No indication for surgery. CT abdomen and pelvis is unremarkable. -Pain control  #. UTI with Leukocytosis Start Rocephin. Follow-up CBC and cultures  #. History of seizures -Continue Keppra and Vimpat.  Hypokalemia. Give potassium supplement. Hypomagnesemia. Give IV magnesium  and a follow-up level. Tobacco abuse. Smoking cessation was counseled for 3-4 minutes.  All the records are reviewed and case discussed with Care Management/Social Worker. Management plans discussed with the patient, his mother and they are in agreement.  CODE STATUS: Full Code  TOTAL TIME TAKING CARE OF THIS PATIENT: 36 minutes.   More than 50% of the time was spent in counseling/coordination of care: YES  POSSIBLE D/C IN 1-2 DAYS, DEPENDING ON CLINICAL CONDITION.   Shaune Pollack M.D on 01/15/2017 at 12:11 PM  Between 7am to 6pm - Pager - [phone]  After 6pm go to [email or web address] - Social research officer, government  [facility] [place] [facility]  Office  [phone]  CC: Primary care physician; System, Pcp Not In  Note: This dictation was prepared with Dragon dictation along with smaller phrase technology. Any transcriptional errors that result from this process are unintentional.

## 2017-01-15 NOTE — Progress Notes (Signed)
CC: Abdominal pain Subjective: Ultrasound personally reviewed there is evidence of gallstones. Questionable Murphy sign. Yesterday he did not have any Murphy sign per Dr. Tonita CongWoodham. His white count is elevated. He continues to have abdominal pain nausea and vomiting. White count this morning has dropped to 14,000.  Objective: Vital signs in last 24 hours: Temp:  [98.7 F (37.1 C)-99.3 F (37.4 C)] 98.7 F (37.1 C) (07/08 0455) Pulse Rate:  [66-125] 78 (07/08 0455) Resp:  [16-20] 20 (07/08 0455) BP: (94-144)/(51-84) 102/65 (07/08 0455) SpO2:  [96 %-100 %] 99 % (07/08 0455) Weight:  [64.1 kg (141 lb 4.8 oz)-65.8 kg (145 lb)] 64.1 kg (141 lb 4.8 oz) (07/08 0035) Last BM Date: 01/14/17  Intake/Output from previous day: 07/07 0701 - 07/08 0700 In: 2810 [P.O.:120; I.V.:370; IV Piggyback:2320] Out: 400 [Urine:400] Intake/Output this shift: No intake/output data recorded.  Physical exam: NAD, awake and alert Abd: soft, diffuse TTP, no peritonitis or rebound. No murphy Ext: well perfused, no edema  Lab Results: CBC   Recent Labs  01/14/17 1646 01/15/17 0359  WBC 20.1* 14.5*  HGB 18.8* 14.7  HCT 54.7* 43.4  PLT 301 244   BMET  Recent Labs  01/14/17 1646 01/15/17 0359  NA 137 137  K 3.2* 3.4*  CL 90* 101  CO2 32 27  GLUCOSE 141* 105*  BUN 23* 19  CREATININE 1.20 0.98  CALCIUM 10.2 8.5*   PT/INR No results for input(s): LABPROT, INR in the last 72 hours. ABG No results for input(s): PHART, HCO3 in the last 72 hours.  Invalid input(s): PCO2, PO2  Studies/Results: [place] Abdomen Limited Ruq  Result Date: 01/14/2017 CLINICAL DATA:  26 y/o M; epigastric pain, nausea, vomiting, and diarrhea for 2 days. EXAM: ULTRASOUND ABDOMEN LIMITED RIGHT UPPER QUADRANT COMPARISON:  None. FINDINGS: Gallbladder: No gallbladder wall thickening or pericholecystic fluid. Positive sonographic Murphy sign. Gallbladder stones measuring up to 9 mm and gallbladder sludge. Common bile duct:  Diameter: 4.8 mm Liver: No focal lesion identified. Within normal limits in parenchymal echogenicity. IMPRESSION: Gallstones and positive sonographic Murphy sign may represent acute cholecystitis in the appropriate clinical setting. No gallbladder wall thickening, pericholecystic fluid, or common bile duct dilatation. Electronically Signed   By: Mitzi HansenLance  Furusawa-Stratton M.D.   On: 01/14/2017 20:43    Anti-infectives: Anti-infectives    None      Assessment/Plan: Abd pain ? Cholecystitis, Not classical presentation. I will order CT Sca n A/p and place him NPO. No need for emergent surgical intervention D/W dr. Imogene Burnhen and the family in detail Time spent 35 min with > 50 % used for coordination of care and counseling.  Sterling Bigiego Permelia Bamba, MD, [facility]  01/15/2017

## 2017-01-16 DIAGNOSIS — A09 Infectious gastroenteritis and colitis, unspecified: Secondary | ICD-10-CM

## 2017-01-16 LAB — CBC
HEMATOCRIT: 41.2 % (ref 40.0–52.0)
Hemoglobin: 14.2 g/dL (ref 13.0–18.0)
MCH: 28.8 pg (ref 26.0–34.0)
MCHC: 34.5 g/dL (ref 32.0–36.0)
MCV: 83.7 fL (ref 80.0–100.0)
PLATELETS: 186 10*3/uL (ref 150–440)
RBC: 4.92 MIL/uL (ref 4.40–5.90)
RDW: 14.2 % (ref 11.5–14.5)
WBC: 10.4 10*3/uL (ref 3.8–10.6)

## 2017-01-16 LAB — URINE CULTURE: CULTURE: NO GROWTH

## 2017-01-16 LAB — BASIC METABOLIC PANEL
Anion gap: 6 (ref 5–15)
BUN: 11 mg/dL (ref 6–20)
CALCIUM: 8.5 mg/dL — AB (ref 8.9–10.3)
CO2: 25 mmol/L (ref 22–32)
CREATININE: 1 mg/dL (ref 0.61–1.24)
Chloride: 105 mmol/L (ref 101–111)
GFR calc Af Amer: >60 mL/min (ref >60)
GFR calc non Af Amer: >60 mL/min (ref >60)
GLUCOSE: 106 mg/dL — AB (ref 65–99)
Potassium: 3.6 mmol/L (ref 3.5–5.1)
Sodium: 136 mmol/L (ref 135–145)

## 2017-01-16 LAB — MAGNESIUM: Magnesium: 2 mg/dL (ref 1.7–2.4)

## 2017-01-16 LAB — HIV ANTIBODY (ROUTINE TESTING W REFLEX): HIV SCREEN 4TH GENERATION: NONREACTIVE

## 2017-01-16 MED ORDER — CIPROFLOXACIN HCL 500 MG PO TABS: 500.0000 mg | ORAL_TABLET | Freq: Two times a day (BID) | ORAL | 0 refills | Status: DC

## 2017-01-16 MED ORDER — CIPROFLOXACIN HCL 500 MG PO TABS
500.0000 mg | ORAL_TABLET | Freq: Two times a day (BID) | ORAL | Status: DC
  Administered 2017-01-16: 500 mg via ORAL
  Filled 2017-01-16: qty 1

## 2017-01-16 MED ORDER — ONDANSETRON HCL 4 MG PO TABS
4.0000 mg | ORAL_TABLET | Freq: Four times a day (QID) | ORAL | 0 refills | Status: AC | PRN
Start: 2017-01-16 — End: ?

## 2017-01-16 NOTE — Progress Notes (Signed)
01/16/2017 12:31 PM  Trevor Thornton to be D/C'd Home per MD order.  Discussed prescriptions and follow up appointments with the patient. Prescriptions given to patient, medication list explained in detail. Pt verbalized understanding.  Allergies as of 01/16/2017   No Known Allergies     Medication List    TAKE these medications   albuterol 108 (90 Base) MCG/ACT inhaler Commonly known as:  PROVENTIL HFA;VENTOLIN HFA Inhale 1-2 puffs into the lungs every 6 (six) hours as needed for wheezing or shortness of breath.   ciprofloxacin 500 MG tablet Commonly known as:  CIPRO Take 1 tablet (500 mg total) by mouth 2 (two) times daily.   levETIRAcetam 500 MG tablet Commonly known as:  KEPPRA Take 500 mg by mouth daily.   ondansetron 4 MG tablet Commonly known as:  ZOFRAN Take 1 tablet (4 mg total) by mouth every 6 (six) hours as needed for nausea, vomiting or refractory nausea / vomiting. Notes to patient:  Last dose given today at 9:00am   VIMPAT 100 MG Tabs Generic drug:  Lacosamide Take 100 mg by mouth daily.   VITAMIN B-12 PO Take 1 tablet by mouth daily.   VITAMIN D PO Take 1 tablet by mouth daily.       Vitals:   01/16/17 0453 01/16/17 1200  BP: 121/76 118/61  Pulse: 77 77  Resp: 20 20  Temp: 98.5 F (36.9 C) 98.2 F (36.8 C)    Skin clean, dry and intact without evidence of skin break down, no evidence of skin tears noted. IV catheter discontinued intact. Site without signs and symptoms of complications. Dressing and pressure applied. Pt denies pain at this time. No complaints noted.  An After Visit Summary was printed and given to the patient. Patient escorted via WC, and D/C home via private auto.  Bradly Chrisougherty, Taelyn Broecker E

## 2017-01-16 NOTE — Care Management (Signed)
Patient to discharge today.  Patient states that his PCP is Dr. Clint [facility]Lindley at [facility].  Patient states that he goes every 6 months and receives his seizure medication at a discounted price.  Patient [facility] with [facility]ication management.  Patient provided coupons from goodrx.com for Zofran $6.40, Cipro is $4 without a coupon.  RNCM signing off

## 2017-01-16 NOTE — Discharge Instructions (Signed)
Smoking cessation  

## 2017-01-16 NOTE — Progress Notes (Signed)
SURGICAL PROGRESS NOTE (cpt [id number])  Hospital Day(s): 0.    Post op day(s):  Marland Kitchen.   Interval History: Patient seen and examined, vomited once overnight, but otherwise reports abdominal pain and nausea have all significantly improved since admission despite ongoing diarrhea, denies any fever/chills, CP, or SOB.  Review of Systems:  Constitutional: denies fever, chills  HEENT: denies cough or congestion  Respiratory: denies any shortness of breath  Cardiovascular: denies chest pain or palpitations  Gastrointestinal: abdominal pain, N/V, and bowel function as per interval history Genitourinary: denies burning with urination or urinary frequency Musculoskeletal: denies pain, decreased motor or sensation Integumentary: denies any other rashes or skin discolorations Neurological: denies HA or vision/hearing changes   Vital signs in last 24 hours: [min-max] current  Temp:  [98.1 F (36.7 C)-98.8 F (37.1 C)] 98.5 F (36.9 C) (07/09 0453) Pulse Rate:  [77-85] 77 (07/09 0453) Resp:  [18-20] 20 (07/09 0453) BP: (117-127)/(66-78) 121/76 (07/09 0453) SpO2:  [95 %-100 %] 100 % (07/09 0453)     Height: 5\' 10"  (177.8 cm) Weight: 141 lb 4.8 oz (64.1 kg) BMI (Calculated): 20.3   Intake/Output this shift:  Total I/O In: -  Out: 125 [Urine:125]   Intake/Output last 2 shifts:  @IOLAST2SHIFTS @   Physical Exam:  Constitutional: alert, cooperative and no distress  HENT: normocephalic without obvious abnormality  Eyes: PERRL, EOM's grossly intact and symmetric  Neuro: CN II - XII grossly intact and symmetric without deficit  Respiratory: breathing non-labored at rest  Cardiovascular: regular rate and sinus rhythm  Gastrointestinal: soft, minimal upper abdominal tenderness to palpation, and non-distended  Musculoskeletal: UE and LE FROM, no edema or wounds, motor and sensation grossly intact, NT   Labs:  CBC Latest Ref Rng & Units 01/16/2017 01/15/2017 01/14/2017  WBC 3.8 - 10.6 K/uL 10.4 14.5(H)  20.1(H)  Hemoglobin 13.0 - 18.0 g/dL 13.214.2 44.014.7 18.8(H)  Hematocrit 40.0 - 52.0 % 41.2 43.4 54.7(H)  Platelets 150 - 440 K/uL 186 244 301   CMP Latest Ref Rng & Units 01/16/2017 01/15/2017 01/14/2017  Glucose 65 - 99 mg/dL 102(V106(H) 253(G105(H) 644(I141(H)  BUN 6 - 20 mg/dL 11 19 34(V23(H)  Creatinine 0.61 - 1.24 mg/dL 4.251.00 9.560.98 3.871.20  Sodium 135 - 145 mmol/L 136 137 137  Potassium 3.5 - 5.1 mmol/L 3.6 3.4(L) 3.2(L)  Chloride 101 - 111 mmol/L 105 101 90(L)  CO2 22 - 32 mmol/L 25 27 32  Calcium 8.9 - 10.3 mg/dL 5.6(E8.5(L) 3.3(I8.5(L) 95.110.2  Total Protein 6.5 - 8.1 g/dL - - 9.9(H)  Total Bilirubin 0.3 - 1.2 mg/dL - - 1.8(H)  Alkaline Phos 38 - 126 U/L - - 72  AST 15 - 41 U/L - - 88(H)  ALT 17 - 63 U/L - - 60   Imaging studies: No new pertinent imaging studies available for review   Assessment/Plan: (ICD-10's: 3509) 26 y.o. male with improving presumed gastroenteritis along with asymptomatic cholelithiasis, complicated by pertinent comorbidities including seizure disorder and polysubstance abuse.   - diet as tolerated, maintain hydration   - okay with discharge from surgical perspective   - medical management as per medical team   All of the above findings and recommendations were discussed with the patient, and all of patient's questions were answered to hi expressed satisfaction.  Thank you for the opportunity to participate in this patient's care.  -- Scherrie GerlachJason E. Earlene Plateravis, MD, RPVI [place]: [facility] - Partnering for exceptional care. Office: [phone]

## 2017-01-16 NOTE — Discharge Summary (Signed)
[facility] - [place] at [facility]   PATIENT NAME: Trevor Thornton    MR#:  [id number]  DATE OF BIRTH:  1990/10/28  DATE OF ADMISSION:  01/14/2017   ADMITTING PHYSICIAN: Tonye Royalty, DO  DATE OF DISCHARGE: 01/16/2017 PRIMARY CARE PHYSICIAN: Armando Gang, FNP   ADMISSION DIAGNOSIS:  Gallstones [K80.20] Abdominal pain [R10.9] Abdominal pain, unspecified abdominal location [R10.9] DISCHARGE DIAGNOSIS:  Active Problems:   Dehydration   Gallstones   Abdominal pain UTI. SECONDARY DIAGNOSIS:   Past Medical History:  Diagnosis Date  . Seizures (HCC)   . Substance abuse    HOSPITAL COURSE:  This is a 26 y.o.malewith a history of seizures, substance abusenow being admitted with:  #. Dehydration secondary to gastroenteritis Improved with IV fluid hydration Soft diet. -Antiemetics and antidiarrheal agents as needed  #. Abdominal pain with gallstones, no evidence of acute cholecystitis -Surgical input appreciated, No indication for surgery. CT abdomen and pelvis is unremarkable. Abdominal pain, nausea, vomiting and diarrhea are better.  #. UTI with Leukocytosis Started Rocephin. Change to cipro po. Leukocytosis improved.  #. History of seizures -Continue Keppra and Vimpat.  Hypokalemia. Give potassium supplement. Improved. Hypomagnesemia. Give IV magnesium and improved. Tobacco abuse. Smoking cessation was counseled for 3-4 minutes.  DISCHARGE CONDITIONS:  Stable, discharge to home today. CONSULTS OBTAINED:  Treatment Team:  Ricarda Frame, MD DRUG ALLERGIES:  No Known Allergies DISCHARGE MEDICATIONS:   Allergies as of 01/16/2017   No Known Allergies     Medication List    TAKE these medications   albuterol 108 (90 Base) MCG/ACT inhaler Commonly known as:  PROVENTIL HFA;VENTOLIN HFA Inhale 1-2 puffs into the lungs every 6 (six) hours as needed for wheezing or shortness of breath.   ciprofloxacin 500 MG tablet Commonly known  as:  CIPRO Take 1 tablet (500 mg total) by mouth 2 (two) times daily.   levETIRAcetam 500 MG tablet Commonly known as:  KEPPRA Take 500 mg by mouth daily.   ondansetron 4 MG tablet Commonly known as:  ZOFRAN Take 1 tablet (4 mg total) by mouth every 6 (six) hours as needed for nausea, vomiting or refractory nausea / vomiting. Notes to patient:  Last dose given today at 9:00am   VIMPAT 100 MG Tabs Generic drug:  Lacosamide Take 100 mg by mouth daily.   VITAMIN B-12 PO Take 1 tablet by mouth daily.   VITAMIN D PO Take 1 tablet by mouth daily.        DISCHARGE INSTRUCTIONS:  See AVS.  If you experience worsening of your admission symptoms, develop shortness of breath, life threatening emergency, suicidal or homicidal thoughts you must seek medical attention immediately by calling 911 or calling your MD immediately  if symptoms less severe.  You Must read complete instructions/literature along with all the possible adverse reactions/side effects for all the Medicines you take and that have been prescribed to you. Take any new Medicines after you have completely understood and accpet all the possible adverse reactions/side effects.   Please note  You were cared for by a hospitalist during your hospital stay. If you have any questions about your discharge medications or the care you received while you were in the hospital after you are discharged, you can call the unit and asked to speak with the hospitalist on call if the hospitalist that took care of you is not available. Once you are discharged, your primary care physician will handle any further medical issues. Please note that NO REFILLS  for any discharge medications will be authorized once you are discharged, as it is imperative that you return to your primary care physician (or establish a relationship with a primary care physician if you do not have one) for your aftercare needs so that they can reassess your need for medications  and monitor your lab values.    On the day of Discharge:  VITAL SIGNS:  Blood pressure 121/76, pulse 77, temperature 98.5 F (36.9 C), temperature source Oral, resp. rate 20, height 5\' 10"  (1.778 m), weight 141 lb 4.8 oz (64.1 kg), SpO2 100 %. PHYSICAL EXAMINATION:  GENERAL:  26 y.o.-year-old patient lying in the bed with no acute distress.  EYES: Pupils equal, round, reactive to light and accommodation. No scleral icterus. Extraocular muscles intact.  HEENT: Head atraumatic, normocephalic. Oropharynx and nasopharynx clear.  NECK:  Supple, no jugular venous distention. No thyroid enlargement, no tenderness.  LUNGS: Normal breath sounds bilaterally, no wheezing, rales,rhonchi or crepitation. No use of accessory muscles of respiration.  CARDIOVASCULAR: S1, S2 normal. No murmurs, rubs, or gallops.  ABDOMEN: Soft, diffuse tenderness, no guarding or rebound, non-distended. Bowel sounds present. No organomegaly or mass.  EXTREMITIES: No pedal edema, cyanosis, or clubbing.  NEUROLOGIC: Cranial nerves II through XII are intact. Muscle strength 5/5 in all extremities. Sensation intact. Gait not checked.  PSYCHIATRIC: The patient is alert and oriented x 3.  SKIN: No obvious rash, lesion, or ulcer.  DATA REVIEW:   CBC  Recent Labs Lab 01/16/17 0343  WBC 10.4  HGB 14.2  HCT 41.2  PLT 186    Chemistries   Recent Labs Lab 01/14/17 1646  01/16/17 0343  NA 137  < > 136  K 3.2*  < > 3.6  CL 90*  < > 105  CO2 32  < > 25  GLUCOSE 141*  < > 106*  BUN 23*  < > 11  CREATININE 1.20  < > 1.00  CALCIUM 10.2  < > 8.5*  MG  --   < > 2.0  AST 88*  --   --   ALT 60  --   --   ALKPHOS 72  --   --   BILITOT 1.8*  --   --   < > = values in this interval not displayed.   Microbiology Results  No results found for this or any previous visit.  RADIOLOGY:  Ct Abdomen Pelvis W Contrast  Result Date: 01/15/2017 CLINICAL DATA:  Patient with nausea, vomiting, diarrhea and abdominal pain. EXAM: CT  ABDOMEN AND PELVIS WITH CONTRAST TECHNIQUE: Multidetector CT imaging of the abdomen and pelvis was performed using the standard protocol following bolus administration of intravenous contrast. CONTRAST:  ISOVUE-300 IOPAMIDOL (ISOVUE-300) INJECTION 61% COMPARISON:  Ultrasound right upper quadrant 01/14/2017. FINDINGS: Lower chest: Normal heart size. Dependent atelectasis within the bilateral lower lobes. No pleural effusion. Hepatobiliary: Liver is normal in size and contour. No focal lesion identified. Gallbladder is unremarkable. Pancreas: Unremarkable Spleen: Unremarkable Adrenals/Urinary Tract: The adrenal glands are normal. Kidneys enhance symmetrically with contrast. No hydronephrosis. Left extrarenal pelvis. Urinary bladder is unremarkable. Stomach/Bowel: Descending colon is decompressed, limiting evaluation. No evidence for bowel obstruction. Normal appendix. Normal morphology of the stomach. No free fluid or free intraperitoneal air. Vascular/Lymphatic: Normal caliber abdominal aorta. No retroperitoneal lymphadenopathy. Reproductive: Prostate unremarkable. Other: None. Musculoskeletal: No aggressive or acute appearing osseous lesions. IMPRESSION: No acute process within the abdomen or pelvis. Electronically Signed   By: Annia Belt M.D.   On: 01/15/2017  11:28     Management plans discussed with the patient, family and they are in agreement.  CODE STATUS: Full Code   TOTAL TIME TAKING CARE OF THIS PATIENT: 36 minutes.    Shaune Pollackhen, Ellieanna Funderburg M.D on 01/16/2017 at 11:09 AM  Between 7am to 6pm - Pager - [phone]  After 6pm go to [email or web address] - Social research officer, governmentpassword EPAS [facility]  [facility] [place] [facility]  Office  [phone]  CC: Primary care physician; Armando GangLindley, Cheryl P, FNP   Note: This dictation was prepared with Dragon dictation along with smaller phrase technology. Any transcriptional errors that result from this process are unintentional.

## 2017-04-02 DIAGNOSIS — A09 Infectious gastroenteritis and colitis, unspecified: Secondary | ICD-10-CM

## 2017-10-12 ENCOUNTER — Encounter: Payer: Self-pay | Admitting: Emergency Medicine

## 2017-10-12 ENCOUNTER — Other Ambulatory Visit: Payer: Self-pay

## 2017-10-12 ENCOUNTER — Emergency Department
Admission: EM | Admit: 2017-10-12 | Discharge: 2017-10-12 | Disposition: A | Discharge status: Home / Self Care | Payer: Self-pay | Attending: Emergency Medicine | Admitting: Emergency Medicine

## 2017-10-12 ENCOUNTER — Emergency Department: Payer: Self-pay

## 2017-10-12 DIAGNOSIS — F17210 Nicotine dependence, cigarettes, uncomplicated: Secondary | ICD-10-CM | POA: Insufficient documentation

## 2017-10-12 DIAGNOSIS — Y939 Activity, unspecified: Secondary | ICD-10-CM | POA: Insufficient documentation

## 2017-10-12 DIAGNOSIS — X58XXXA Exposure to other specified factors, initial encounter: Secondary | ICD-10-CM | POA: Insufficient documentation

## 2017-10-12 DIAGNOSIS — Z79899 Other long term (current) drug therapy: Secondary | ICD-10-CM | POA: Insufficient documentation

## 2017-10-12 DIAGNOSIS — S43015A Anterior dislocation of left humerus, initial encounter: Secondary | ICD-10-CM | POA: Insufficient documentation

## 2017-10-12 DIAGNOSIS — S43005A Unspecified dislocation of left shoulder joint, initial encounter: Secondary | ICD-10-CM

## 2017-10-12 DIAGNOSIS — Y929 Unspecified place or not applicable: Secondary | ICD-10-CM | POA: Insufficient documentation

## 2017-10-12 DIAGNOSIS — Y999 Unspecified external cause status: Secondary | ICD-10-CM | POA: Insufficient documentation

## 2017-10-12 MED ORDER — MORPHINE SULFATE (PF) 2 MG/ML IV SOLN
INTRAVENOUS | Status: AC
  Filled 2017-10-12: qty 1

## 2017-10-12 MED ORDER — DIPHENHYDRAMINE HCL 50 MG/ML IJ SOLN
25.0000 mg | Freq: Once | INTRAMUSCULAR | Status: AC
  Administered 2017-10-12: 25 mg via INTRAVENOUS
  Filled 2017-10-12: qty 1

## 2017-10-12 MED ORDER — MORPHINE SULFATE (PF) 4 MG/ML IV SOLN
4.0000 mg | Freq: Once | INTRAVENOUS | Status: DC
  Filled 2017-10-12: qty 1

## 2017-10-12 MED ORDER — MORPHINE SULFATE (PF) 2 MG/ML IV SOLN
INTRAVENOUS | Status: AC
  Administered 2017-10-12: 4 mg via INTRAVENOUS
  Filled 2017-10-12: qty 2

## 2017-10-12 MED ORDER — MORPHINE SULFATE (PF) 4 MG/ML IV SOLN
4.0000 mg | Freq: Once | INTRAVENOUS | Status: AC
  Administered 2017-10-12: 4 mg via INTRAVENOUS

## 2017-10-12 NOTE — ED Notes (Signed)
EDP noticed welts on pt's arm after this RN gave 2nd dose of morphine.  EDP gave VO for benadryl 25mg  IV push.  Morphine added to patients allergy list.

## 2017-10-12 NOTE — ED Provider Notes (Signed)
[facility] Emergency Department Provider Note  ____________________________________________  Time seen: Approximately 10:52 PM  I have reviewed the triage vital signs and the nursing notes.   HISTORY  Chief Complaint Shoulder Injury   HPI Trevor Thornton is a 27 y.o. male with a history of substance abuse, seizure disorder, and several prior shoulder dislocations who presents for left shoulder dislocation. Patient reports that he is usually able to relocate the shoulder on his own.  Today he has been unsuccessful.  The shoulder has been out for an hour.  He is complaining of severe constant sharp pain worse with minimal movement of the shoulder.  He denies trauma.  Past Medical History:  Diagnosis Date  . Seizures (HCC)   . Seizures (HCC)   . Substance abuse [facility](HCC)     Patient Active Problem List   Diagnosis Date Noted  . Infectious colitis, enteritis, and gastroenteritis   . Gallstones   . Abdominal pain   . Dehydration 01/14/2017    No past surgical history on file.  Prior to Admission medications   Medication Sig Start Date End Date Taking? Authorizing Provider  albuterol (PROVENTIL HFA;VENTOLIN HFA) 108 (90 BASE) MCG/ACT inhaler Inhale 1-2 puffs into the lungs every 6 (six) hours as needed for wheezing or shortness of breath.    [provider]  Cholecalciferol (VITAMIN D PO) Take 1 tablet by mouth daily.    [provider]  ciprofloxacin (CIPRO) 500 MG tablet Take 1 tablet (500 mg total) by mouth 2 (two) times daily. 01/16/17   Shaune Pollackhen, Qing, MD  Cyanocobalamin (VITAMIN B-12 PO) Take 1 tablet by mouth daily.    [provider]  Lacosamide (VIMPAT) 100 MG TABS Take 100 mg by mouth daily.     [provider]  levETIRAcetam (KEPPRA) 500 MG tablet Take 500 mg by mouth daily.    [provider]  ondansetron (ZOFRAN) 4 MG tablet Take 1 tablet (4 mg total) by mouth every 6 (six) hours as needed for nausea,  vomiting or refractory nausea / vomiting. 01/16/17   Shaune Pollackhen, Qing, MD    Allergies Morphine and related  No family history on file.  Social History Social History   Tobacco Use  . Smoking status: Current Every Day Smoker    Packs/day: 6.00    Types: Cigarettes  . Smokeless tobacco: Never Used  Substance Use Topics  . Alcohol use: Yes  . Drug use: No    Review of Systems  Constitutional: Negative for fever. Eyes: Negative for visual changes. ENT: Negative for sore throat. Neck: No neck pain  Cardiovascular: Negative for chest pain. Respiratory: Negative for shortness of breath. Gastrointestinal: Negative for abdominal pain, vomiting or diarrhea. Genitourinary: Negative for dysuria. Musculoskeletal: Negative for back pain. + L shoulder pain Skin: Negative for rash. Neurological: Negative for headaches, weakness or numbness. Psych: No SI or HI  ____________________________________________   PHYSICAL EXAM:  VITAL SIGNS: ED Triage Vitals  Enc Vitals Group     BP 10/12/17 2135 (!) 151/92     Pulse Rate 10/12/17 2135 96     Resp 10/12/17 2135 20     Temp 10/12/17 2135 98.1 F (36.7 C)     Temp Source 10/12/17 2135 Oral     SpO2 10/12/17 2135 97 %     Weight 10/12/17 2134 140 lb (63.5 kg)     Height 10/12/17 2134 5\' 10"  (1.778 m)     Head Circumference --  Peak Flow --      Pain Score 10/12/17 2134 10     Pain Loc --      Pain Edu? --      Excl. in GC? --     Constitutional: Alert and oriented. Well appearing and in no apparent distress. HEENT:      Head: Normocephalic and atraumatic.         Eyes: Conjunctivae are normal. Sclera is non-icteric.       Mouth/Throat: Mucous membranes are moist.       Neck: Supple with no signs of meningismus. Cardiovascular: Regular rate and rhythm. No murmurs, gallops, or rubs. 2+ symmetrical distal pulses are present in all extremities. No JVD. Respiratory: Normal respiratory effort. Lungs are clear to auscultation  bilaterally. No wheezes, crackles, or rhonchi.  Gastrointestinal: Soft, non tender, and non distended with positive bowel sounds. No rebound or guarding. Musculoskeletal: Anterior dislocation of the left shoulder  neurologic: Normal speech and language. Face is symmetric. Moving all extremities. No gross focal neurologic deficits are appreciated. Skin: Skin is warm, dry and intact. No rash noted. Psychiatric: Mood and affect are normal. Speech and behavior are normal.  ____________________________________________   LABS (all labs ordered are listed, but only abnormal results are displayed)  Labs Reviewed - No data to display ____________________________________________  EKG  none  ____________________________________________  RADIOLOGY  I have personally reviewed the images performed during this visit and I agree with the Radiologist's read.   Interpretation by Radiologist:  Dg Shoulder Left  Result Date: 10/12/2017 CLINICAL DATA:  Postreduction EXAM: LEFT SHOULDER - 2+ VIEW COMPARISON:  10/12/2017 FINDINGS: Interval relocation of the glenohumeral joint. Hill-Sachs lesion seen on previous study is not identified due to positioning. Soft tissues are unremarkable. IMPRESSION: Anatomic location of the glenohumeral joint post reduction. Electronically Signed   By: Burman Nieves M.D.   On: 10/12/2017 22:43   Dg Shoulder Left  Result Date: 10/12/2017 CLINICAL DATA:  Shoulder dislocation. EXAM: LEFT SHOULDER - 2+ VIEW COMPARISON:  None. FINDINGS: Anterior dislocation of the humeral head with respect to the glenoid. Questionable flattening of the posterior humeral head. Bone mineralization is normal. Soft tissues are unremarkable. IMPRESSION: 1. Anterior shoulder dislocation. 2. Questionable flattening of the posterior humeral head may reflect Hill-Sachs lesion. Electronically Signed   By: Obie Dredge M.D.   On: 10/12/2017 22:06      ____________________________________________   PROCEDURES  Procedure(s) performed:yes Procedures   Reduction of dislocation Date/Time: 10:57 PM Performed by: Nita Sickle Authorized by: Nita Sickle Consent: Verbal consent obtained. Risks and benefits: risks, benefits and alternatives were discussed Consent given by: patient Required items: required blood products, implants, devices, and special equipment available Time out: Immediately prior to procedure a "time out" was called to verify the correct patient, procedure, equipment, support staff and site/side marked as required.  Patient sedated: no. Pain control with IV morphine  Vitals: Vital signs were monitored during sedation. Patient tolerance: Patient tolerated the procedure well with no immediate complications. Joint: L shoulder Reduction technique: prone, scapular manipulation and downward traction   Critical Care performed: None ____________________________________________   INITIAL IMPRESSION / ASSESSMENT AND PLAN / ED COURSE   27 y.o. male with a history of substance abuse, seizure disorder, and several prior shoulder dislocations who presents for left shoulder dislocation.  Shoulder was reduced per procedure note above.  Repeat postreduction x-ray confirms that shoulder is in place.  Patient was put on a shoulder immobilizer.  Due to several prior dislocations  patient will be referred to orthopedics for further evaluation.  Discussed return precautions and usage of shoulder immobilizer with patient.      As part of my medical decision making, I reviewed the following data within the electronic MEDICAL RECORD NUMBER Nursing notes reviewed and incorporated, Radiograph reviewed , Notes from prior ED visits and  Controlled Substance Database    Pertinent labs & imaging results that were available during my care of the patient were reviewed by me and considered in my medical decision making (see chart for  details).    ____________________________________________   FINAL CLINICAL IMPRESSION(S) / ED DIAGNOSES  Final diagnoses:  Dislocation of left shoulder joint, initial encounter      NEW MEDICATIONS STARTED DURING THIS VISIT:  ED Discharge Orders    None       Note:  This document was prepared using Dragon voice recognition software and may include unintentional dictation errors.    Nita Sickle, MD 10/12/17 [phone]

## 2017-10-12 NOTE — ED Triage Notes (Addendum)
Pt with left shoulder dislocation hx of the same for the last 6-8 months but has always been able to reduce it at home. Pt is a heroin and cocaine user last time today.

## 2017-10-12 NOTE — ED Notes (Signed)
Pt discharged to home.  Family member driving.  Discharge instructions reviewed.  Verbalized understanding.  No questions or concerns at this time.  Teach back verified.  Pt in NAD.  No items left in ED.   

## 2018-02-01 ENCOUNTER — Emergency Department (HOSPITAL_COMMUNITY): Payer: Self-pay

## 2018-02-01 ENCOUNTER — Encounter (HOSPITAL_COMMUNITY): Payer: Self-pay

## 2018-02-01 ENCOUNTER — Emergency Department (HOSPITAL_COMMUNITY)
Admission: EM | Admit: 2018-02-01 | Discharge: 2018-02-02 | Disposition: A | Discharge status: Home / Self Care | Payer: Self-pay | Attending: Emergency Medicine | Admitting: Emergency Medicine

## 2018-02-01 ENCOUNTER — Other Ambulatory Visit: Payer: Self-pay

## 2018-02-01 DIAGNOSIS — F17210 Nicotine dependence, cigarettes, uncomplicated: Secondary | ICD-10-CM | POA: Insufficient documentation

## 2018-02-01 DIAGNOSIS — W01198A Fall on same level from slipping, tripping and stumbling with subsequent striking against other object, initial encounter: Secondary | ICD-10-CM | POA: Insufficient documentation

## 2018-02-01 DIAGNOSIS — Y92511 Restaurant or cafe as the place of occurrence of the external cause: Secondary | ICD-10-CM | POA: Insufficient documentation

## 2018-02-01 DIAGNOSIS — R569 Unspecified convulsions: Secondary | ICD-10-CM | POA: Insufficient documentation

## 2018-02-01 DIAGNOSIS — Y9389 Activity, other specified: Secondary | ICD-10-CM | POA: Insufficient documentation

## 2018-02-01 DIAGNOSIS — Y998 Other external cause status: Secondary | ICD-10-CM | POA: Insufficient documentation

## 2018-02-01 DIAGNOSIS — Z79899 Other long term (current) drug therapy: Secondary | ICD-10-CM | POA: Insufficient documentation

## 2018-02-01 DIAGNOSIS — S0181XA Laceration without foreign body of other part of head, initial encounter: Secondary | ICD-10-CM | POA: Insufficient documentation

## 2018-02-01 LAB — I-STAT CHEM 8, ED
BUN: 20 mg/dL (ref 6–20)
CREATININE: 1 mg/dL (ref 0.61–1.24)
Calcium, Ion: 1.15 mmol/L (ref 1.15–1.40)
Chloride: 102 mmol/L (ref 98–111)
Glucose, Bld: 97 mg/dL (ref 70–99)
HEMATOCRIT: 41 % (ref 39.0–52.0)
Hemoglobin: 13.9 g/dL (ref 13.0–17.0)
POTASSIUM: 4.3 mmol/L (ref 3.5–5.1)
SODIUM: 138 mmol/L (ref 135–145)
TCO2: 28 mmol/L (ref 22–32)

## 2018-02-01 LAB — CBG MONITORING, ED: Glucose-Capillary: 137 mg/dL — ABNORMAL HIGH (ref 70–99)

## 2018-02-01 MED ORDER — LIDOCAINE-EPINEPHRINE (PF) 2 %-1:200000 IJ SOLN
10.0000 mL | Freq: Once | INTRAMUSCULAR | Status: AC
  Administered 2018-02-01: 10 mL
  Filled 2018-02-01: qty 20

## 2018-02-01 MED ORDER — LACOSAMIDE 50 MG PO TABS
100.0000 mg | ORAL_TABLET | Freq: Two times a day (BID) | ORAL | Status: DC
  Administered 2018-02-01: 100 mg via ORAL
  Filled 2018-02-01: qty 2

## 2018-02-01 MED ORDER — IBUPROFEN 200 MG PO TABS
600.0000 mg | ORAL_TABLET | Freq: Once | ORAL | Status: AC
  Administered 2018-02-01: 600 mg via ORAL
  Filled 2018-02-01: qty 3

## 2018-02-01 MED ORDER — LEVETIRACETAM 500 MG PO TABS
500.0000 mg | ORAL_TABLET | Freq: Once | ORAL | Status: AC
  Administered 2018-02-01: 500 mg via ORAL
  Filled 2018-02-01: qty 1

## 2018-02-01 MED ORDER — IBUPROFEN 600 MG PO TABS: 600.0000 mg | ORAL_TABLET | Freq: Four times a day (QID) | ORAL | 0 refills | Status: DC | PRN

## 2018-02-01 MED ORDER — SODIUM CHLORIDE 0.9 % IV SOLN
INTRAVENOUS | Status: DC
  Administered 2018-02-01: 21:00:00 via INTRAVENOUS

## 2018-02-01 NOTE — ED Notes (Addendum)
Pt requesting pain medication. 10/10 pain

## 2018-02-01 NOTE — ED Provider Notes (Signed)
[facility]-EMERGENCY DEPT Provider Note   CSN: [id number] Arrival date & time: 02/01/18  2029     History   Chief Complaint Chief Complaint  Patient presents with  . Seizures    HPI Trevor Thornton is a 27 y.o. male.  HPI Patient presents to the emergency room for evaluation of a seizure.  Patient has a history of seizures.  Patient states he takes Vimpat and Keppra.  He did forget to take his prior medications today.  Normally he is very good about taking his medications and has not had a seizure in a while.  Patient was at a [employer] when he had a seizure and fell forward striking his chin on the ground.  Patient sustained a laceration as well as some abrasions to his knees and hand.  Patient denies any drug use this evening although does have a history of opiate abuse.  Patient is now awake and alert.  He has some pain in his head neck and chin.  He also has some pain in his left shoulder. Past Medical History:  Diagnosis Date  . Seizures (HCC)   . Seizures (HCC)   . Substance abuse [facility])     Patient Active Problem List   Diagnosis Date Noted  . Infectious colitis, enteritis, and gastroenteritis   . Gallstones   . Abdominal pain   . Dehydration 01/14/2017    History reviewed. No pertinent surgical history.      Home Medications    Prior to Admission medications   Medication Sig Start Date End Date Taking? Authorizing Provider  albuterol (PROVENTIL HFA;VENTOLIN HFA) 108 (90 BASE) MCG/ACT inhaler Inhale 1-2 puffs into the lungs every 6 (six) hours as needed for wheezing or shortness of breath.   Yes [provider]  Lacosamide (VIMPAT) 100 MG TABS Take 100 mg by mouth daily.    Yes [provider]  levETIRAcetam (KEPPRA) 500 MG tablet Take 500 mg by mouth daily.   Yes [provider]  ciprofloxacin (CIPRO) 500 MG tablet Take 1 tablet (500 mg total) by mouth 2 (two) times daily. Patient not taking: Reported on 02/01/2018  01/16/17   Shaune Pollack, MD  ibuprofen (ADVIL,MOTRIN) 600 MG tablet Take 1 tablet (600 mg total) by mouth every 6 (six) hours as needed. 02/01/18   Linwood Dibbles, MD  ondansetron (ZOFRAN) 4 MG tablet Take 1 tablet (4 mg total) by mouth every 6 (six) hours as needed for nausea, vomiting or refractory nausea / vomiting. Patient not taking: Reported on 02/01/2018 01/16/17   Shaune Pollack, MD    Family History No family history on file.  Social History Social History   Tobacco Use  . Smoking status: Current Every Day Smoker    Packs/day: 6.00    Types: Cigarettes  . Smokeless tobacco: Never Used  Substance Use Topics  . Alcohol use: Yes  . Drug use: No     Allergies   Morphine and related   Review of Systems Review of Systems  All other systems reviewed and are negative.    Physical Exam Updated Vital Signs BP 105/70   Pulse (!) 101   Temp 99.1 F (37.3 C) (Oral)   Resp 19   SpO2 98%   Physical Exam  Constitutional: He appears well-developed and well-nourished. No distress.  HENT:  Head: Normocephalic.  Right Ear: External ear normal.  Left Ear: External ear normal.  Laceration to the chin, dried blood on the face  Eyes: Conjunctivae are normal.  Right eye exhibits no discharge. Left eye exhibits no discharge. No scleral icterus.  Neck: Neck supple. No tracheal deviation present.  Cardiovascular: Normal rate, regular rhythm and intact distal pulses.  Pulmonary/Chest: Effort normal and breath sounds normal. No stridor. No respiratory distress. He has no wheezes. He has no rales.  Abdominal: Soft. Bowel sounds are normal. He exhibits no distension. There is no tenderness. There is no rebound and no guarding.  Musculoskeletal: He exhibits no edema.       Right shoulder: He exhibits no tenderness, no bony tenderness and no swelling.       Left shoulder: He exhibits tenderness. He exhibits no bony tenderness, no swelling and no deformity.       Right wrist: He exhibits no tenderness,  no bony tenderness and no swelling.       Left wrist: He exhibits no tenderness, no bony tenderness and no swelling.       Right hip: He exhibits normal range of motion, no tenderness, no bony tenderness and no swelling.       Left hip: He exhibits normal range of motion, no tenderness and no bony tenderness.       Right ankle: He exhibits no swelling. No tenderness.       Left ankle: He exhibits no swelling. No tenderness.       Cervical back: He exhibits no tenderness, no bony tenderness and no swelling.       Thoracic back: He exhibits no tenderness, no bony tenderness and no swelling.       Lumbar back: He exhibits no tenderness, no bony tenderness and no swelling.  Neurological: He is alert. He has normal strength. No cranial nerve deficit (no facial droop, extraocular movements intact, no slurred speech) or sensory deficit. He exhibits normal muscle tone. He displays no seizure activity. Coordination normal.  Skin: Skin is warm and dry. No rash noted.  Superficial abrasions noted on the hands and knees  Psychiatric: He has a normal mood and affect.  Nursing note and vitals reviewed.    ED Treatments / Results  Labs (all labs ordered are listed, but only abnormal results are displayed) Labs Reviewed  CBG MONITORING, ED - Abnormal; Notable for the following components:      Result Value   Glucose-Capillary 137 (*)    All other components within normal limits  I-STAT CHEM 8, ED    EKG EKG Interpretation  Date/Time:  Thursday February 01 2018 20:39:25 EDT Ventricular Rate:  115 PR Interval:    QRS Duration: 83 QT Interval:  302 QTC Calculation: 418 R Axis:   76 Text Interpretation:  Sinus tachycardia Borderline T abnormalities, inferior leads No significant change since last tracing Confirmed by Linwood Dibbles [phone]) on 02/01/2018 8:46:38 PM   Radiology Ct Head Wo Contrast  Result Date: 02/01/2018 CLINICAL DATA:  Fall after seizure.  Neck pain. EXAM: CT HEAD WITHOUT CONTRAST CT  CERVICAL SPINE WITHOUT CONTRAST TECHNIQUE: Multidetector CT imaging of the head and cervical spine was performed following the standard protocol without intravenous contrast. Multiplanar CT image reconstructions of the cervical spine were also generated. COMPARISON:  None. FINDINGS: CT HEAD FINDINGS Brain: No evidence of acute infarction, hemorrhage, hydrocephalus, extra-axial collection or mass lesion/mass effect. Vascular: No hyperdense vessel or unexpected calcification. Skull: Normal. Negative for fracture or focal lesion. Sinuses/Orbits: No acute finding. Other: None. CT CERVICAL SPINE FINDINGS Alignment: Normal. Skull base and vertebrae: No acute fracture. No primary bone lesion or focal pathologic process. Soft tissues  and spinal canal: No prevertebral fluid or swelling. No visible canal hematoma. Disc levels:  Normal. Upper chest: Negative. Other: None. IMPRESSION: Normal head CT. Normal cervical spine. Electronically Signed   By: Lupita Raider, M.D.   On: 02/01/2018 21:59   Ct Cervical Spine Wo Contrast  Result Date: 02/01/2018 CLINICAL DATA:  Fall after seizure.  Neck pain. EXAM: CT HEAD WITHOUT CONTRAST CT CERVICAL SPINE WITHOUT CONTRAST TECHNIQUE: Multidetector CT imaging of the head and cervical spine was performed following the standard protocol without intravenous contrast. Multiplanar CT image reconstructions of the cervical spine were also generated. COMPARISON:  None. FINDINGS: CT HEAD FINDINGS Brain: No evidence of acute infarction, hemorrhage, hydrocephalus, extra-axial collection or mass lesion/mass effect. Vascular: No hyperdense vessel or unexpected calcification. Skull: Normal. Negative for fracture or focal lesion. Sinuses/Orbits: No acute finding. Other: None. CT CERVICAL SPINE FINDINGS Alignment: Normal. Skull base and vertebrae: No acute fracture. No primary bone lesion or focal pathologic process. Soft tissues and spinal canal: No prevertebral fluid or swelling. No visible canal  hematoma. Disc levels:  Normal. Upper chest: Negative. Other: None. IMPRESSION: Normal head CT. Normal cervical spine. Electronically Signed   By: Lupita Raider, M.D.   On: 02/01/2018 21:59   Dg Shoulder Left  Result Date: 02/01/2018 CLINICAL DATA:  Seizure pain to the lateral shoulder EXAM: LEFT SHOULDER - 2+ VIEW COMPARISON:  None. FINDINGS: There is no evidence of fracture or dislocation. There is no evidence of arthropathy or other focal bone abnormality. Soft tissues are unremarkable. IMPRESSION: Negative. Electronically Signed   By: Jasmine Pang M.D.   On: 02/01/2018 21:03    Procedures .Marland KitchenLaceration Repair Date/Time: 02/01/2018 11:16 PM Performed by: Linwood Dibbles, MD Authorized by: Linwood Dibbles, MD   Consent:    Consent obtained:  Verbal   Consent given by:  Patient   Risks discussed:  Infection, need for additional repair, pain, poor cosmetic result and poor wound healing   Alternatives discussed:  No treatment and delayed treatment Universal protocol:    Procedure explained and questions answered to patient or proxy's satisfaction: yes     Relevant documents present and verified: yes     Test results available and properly labeled: yes     Imaging studies available: yes     Required blood products, implants, devices, and special equipment available: yes     Site/side marked: yes     Immediately prior to procedure, a time out was called: yes     Patient identity confirmed:  Verbally with patient Anesthesia (see MAR for exact dosages):    Anesthesia method:  Local infiltration Laceration details:    Location: chin.   Length (cm):  2 Repair type:    Repair type:  Simple Pre-procedure details:    Preparation:  Patient was prepped and draped in usual sterile fashion Exploration:    Contaminated: no   Treatment:    Area cleansed with:  Betadine and saline   Amount of cleaning:  Standard   Irrigation solution:  Sterile saline   Irrigation method:  Pressure wash   Visualized  foreign bodies/material removed: no   Skin repair:    Repair method:  Sutures   Suture size:  5-0   Wound skin closure material used: vicryl rapide.   Suture technique:  Simple interrupted   Number of sutures:  5   (including critical care time)  Medications Ordered in ED Medications  0.9 %  sodium chloride infusion ( Intravenous New Bag/Given 02/01/18 2109)  lacosamide (VIMPAT) tablet 100 mg (100 mg Oral Given 02/01/18 2057)  ibuprofen (ADVIL,MOTRIN) tablet 600 mg (has no administration in time range)  lidocaine-EPINEPHrine (XYLOCAINE W/EPI) 2 %-1:200000 (PF) injection 10 mL (10 mLs Infiltration Given 02/01/18 2106)  levETIRAcetam (KEPPRA) tablet 500 mg (500 mg Oral Given 02/01/18 2057)     Initial Impression / Assessment and Plan / ED Course  I have reviewed the triage vital signs and the nursing notes.  Pertinent labs & imaging results that were available during my care of the patient were reviewed by me and considered in my medical decision making (see chart for details).   Patient presented to the emergency room for evaluation of a chin laceration associated with a seizure.  Patient admits to missing her dose of medications today.  He has remained able in the ER today with no further seizures.  He was given a dose of his seizure medications.  Patient's x-rays do not show any evidence of serious injury.  Patient will be discharged home with prescription of NSAIDs.  His chin laceration was repaired without difficulty.  Final Clinical Impressions(s) / ED Diagnoses   Final diagnoses:  Seizure (HCC)  Chin laceration, initial encounter    ED Discharge Orders        Ordered    ibuprofen (ADVIL,MOTRIN) 600 MG tablet  Every 6 hours PRN     02/01/18 2313       Linwood DibblesKnapp, Raidon Swanner, MD 02/01/18 2317

## 2018-02-01 NOTE — ED Notes (Signed)
Patient transported to CT 

## 2018-02-01 NOTE — ED Notes (Signed)
Bed: [id number] Expected date:  Expected time:  Means of arrival:  Comments: EMS 27 yo male seizure-face forward into concrete-facial lac-abrasions knees/elbows

## 2018-02-01 NOTE — Discharge Instructions (Signed)
The sutures are absorbable and should dissolve.  Apply abx ointment to the wounds daily.    Take the medications as needed for pain

## 2018-02-01 NOTE — ED Notes (Signed)
Pt back from CT

## 2018-02-01 NOTE — ED Notes (Signed)
Patient transported to X-ray 

## 2018-02-01 NOTE — ED Triage Notes (Signed)
[facility] of [facility]-friend states they were leaving a [employer] and patient became rigid with a seizure and fell forward onto concrete face first-has chin laceration and abrasions to knees,elbows and top of right hand. Patient is currently alert and oriented. Patient told EMS he has not done heroin tonight. Patient stated to EMS he took his Vimpat this am but not his Keppra.

## 2018-05-08 ENCOUNTER — Emergency Department: Payer: Self-pay

## 2018-05-08 ENCOUNTER — Emergency Department
Admission: EM | Admit: 2018-05-08 | Discharge: 2018-05-09 | Disposition: A | Discharge status: Home / Self Care | Payer: Self-pay | Attending: Emergency Medicine | Admitting: Emergency Medicine

## 2018-05-08 ENCOUNTER — Other Ambulatory Visit: Payer: Self-pay

## 2018-05-08 ENCOUNTER — Encounter: Payer: Self-pay | Admitting: Emergency Medicine

## 2018-05-08 DIAGNOSIS — R1084 Generalized abdominal pain: Secondary | ICD-10-CM | POA: Insufficient documentation

## 2018-05-08 DIAGNOSIS — R197 Diarrhea, unspecified: Secondary | ICD-10-CM | POA: Insufficient documentation

## 2018-05-08 DIAGNOSIS — F17210 Nicotine dependence, cigarettes, uncomplicated: Secondary | ICD-10-CM | POA: Insufficient documentation

## 2018-05-08 DIAGNOSIS — R112 Nausea with vomiting, unspecified: Secondary | ICD-10-CM | POA: Insufficient documentation

## 2018-05-08 DIAGNOSIS — Z79899 Other long term (current) drug therapy: Secondary | ICD-10-CM | POA: Insufficient documentation

## 2018-05-08 LAB — CBC
HEMATOCRIT: 48.9 % (ref 39.0–52.0)
Hemoglobin: 15.8 g/dL (ref 13.0–17.0)
MCH: 28.8 pg (ref 26.0–34.0)
MCHC: 32.3 g/dL (ref 30.0–36.0)
MCV: 89.1 fL (ref 80.0–100.0)
Platelets: 331 10*3/uL (ref 150–400)
RBC: 5.49 MIL/uL (ref 4.22–5.81)
RDW: 14.3 % (ref 11.5–15.5)
WBC: 12.4 10*3/uL — ABNORMAL HIGH (ref 4.0–10.5)
nRBC: 0 % (ref 0.0–0.2)

## 2018-05-08 LAB — URINALYSIS, COMPLETE (UACMP) WITH MICROSCOPIC
BACTERIA UA: NONE SEEN
Glucose, UA: NEGATIVE mg/dL
Hgb urine dipstick: NEGATIVE
Ketones, ur: 80 mg/dL — AB
Leukocytes, UA: NEGATIVE
Nitrite: NEGATIVE
Protein, ur: 30 mg/dL — AB
SPECIFIC GRAVITY, URINE: 1.029 (ref 1.005–1.030)
pH: 7 (ref 5.0–8.0)

## 2018-05-08 LAB — COMPREHENSIVE METABOLIC PANEL
ALBUMIN: 5.4 g/dL — AB (ref 3.5–5.0)
ALT: 39 U/L (ref 0–44)
ANION GAP: 15 (ref 5–15)
AST: 35 U/L (ref 15–41)
Alkaline Phosphatase: 82 U/L (ref 38–126)
BILIRUBIN TOTAL: 0.9 mg/dL (ref 0.3–1.2)
BUN: 14 mg/dL (ref 6–20)
CALCIUM: 10.2 mg/dL (ref 8.9–10.3)
CO2: 23 mmol/L (ref 22–32)
CREATININE: 0.88 mg/dL (ref 0.61–1.24)
Chloride: 101 mmol/L (ref 98–111)
GFR calc non Af Amer: >60 mL/min (ref >60)
GLUCOSE: 107 mg/dL — AB (ref 70–99)
POTASSIUM: 3.9 mmol/L (ref 3.5–5.1)
SODIUM: 139 mmol/L (ref 135–145)
Total Protein: 9.4 g/dL — ABNORMAL HIGH (ref 6.5–8.1)

## 2018-05-08 LAB — LIPASE, BLOOD: Lipase: 19 U/L (ref 11–51)

## 2018-05-08 MED ORDER — CAPSAICIN 0.025 % EX CREA
TOPICAL_CREAM | Freq: Once | CUTANEOUS | Status: AC
  Administered 2018-05-08: via TOPICAL
  Filled 2018-05-08 (×2): qty 60

## 2018-05-08 MED ORDER — IOHEXOL 300 MG/ML  SOLN
100.0000 mL | Freq: Once | INTRAMUSCULAR | Status: AC | PRN
  Administered 2018-05-08: 100 mL via INTRAVENOUS

## 2018-05-08 MED ORDER — ONDANSETRON HCL 4 MG/2ML IJ SOLN
4.0000 mg | Freq: Once | INTRAMUSCULAR | Status: AC
  Administered 2018-05-08: 4 mg via INTRAVENOUS
  Filled 2018-05-08: qty 2

## 2018-05-08 MED ORDER — SODIUM CHLORIDE 0.9 % IV BOLUS
1000.0000 mL | Freq: Once | INTRAVENOUS | Status: AC
  Administered 2018-05-08: 1000 mL via INTRAVENOUS

## 2018-05-08 MED ORDER — PROMETHAZINE HCL 25 MG/ML IJ SOLN
12.5000 mg | Freq: Once | INTRAMUSCULAR | Status: AC
  Administered 2018-05-08: 12.5 mg via INTRAVENOUS
  Filled 2018-05-08: qty 1

## 2018-05-08 MED ORDER — ONDANSETRON 4 MG PO TBDP
4.0000 mg | ORAL_TABLET | Freq: Once | ORAL | Status: AC | PRN
  Administered 2018-05-08: 4 mg via ORAL
  Filled 2018-05-08: qty 1

## 2018-05-08 MED ORDER — FENTANYL CITRATE (PF) 100 MCG/2ML IJ SOLN
25.0000 ug | Freq: Once | INTRAMUSCULAR | Status: AC
  Administered 2018-05-08: 25 ug via INTRAVENOUS
  Filled 2018-05-08: qty 2

## 2018-05-08 MED ORDER — LORAZEPAM 2 MG/ML IJ SOLN
0.5000 mg | Freq: Once | INTRAMUSCULAR | Status: AC
  Administered 2018-05-08: 0.5 mg via INTRAVENOUS
  Filled 2018-05-08: qty 1

## 2018-05-08 NOTE — ED Notes (Signed)
Pt is going to medical imaging.   

## 2018-05-08 NOTE — ED Notes (Signed)
[phone] Aurther Loft Father- he is stepping outside to let pt rest

## 2018-05-08 NOTE — ED Provider Notes (Signed)
[facility] Emergency Department Provider Note  ____________________________________________   First MD Initiated Contact with Patient 05/08/18 2215     (approximate)  I have reviewed the triage vital signs and the nursing notes.   HISTORY  Chief Complaint Emesis and Diarrhea   HPI Trevor Thornton is a 27 y.o. male who presents to the emergency department for treatment and evaluation of persistent nausea, vomiting, and diarrhea.  Symptoms started 3 days ago.  He has been unable to keep any food or fluids down since that time.  Patient states that he is concerned because he is unable to take his Keppra or his methadone.  He thought initially that he was withdrawing from his methadone, but states that he no longer believes that that is what his causing his symptoms.  He also states that he is having diffuse abdominal pain under his ribs that started yesterday.  Past Medical History:  Diagnosis Date  . Seizures (HCC)   . Seizures (HCC)   . Substance abuse [facility])     Patient Active Problem List   Diagnosis Date Noted  . Infectious colitis, enteritis, and gastroenteritis   . Gallstones   . Abdominal pain   . Dehydration 01/14/2017    History reviewed. No pertinent surgical history.  Prior to Admission medications   Medication Sig Start Date End Date Taking? Authorizing Provider  albuterol (PROVENTIL HFA;VENTOLIN HFA) 108 (90 BASE) MCG/ACT inhaler Inhale 1-2 puffs into the lungs every 6 (six) hours as needed for wheezing or shortness of breath.    [provider]  ciprofloxacin (CIPRO) 500 MG tablet Take 1 tablet (500 mg total) by mouth 2 (two) times daily. Patient not taking: Reported on 02/01/2018 01/16/17   Shaune Pollack, MD  ibuprofen (ADVIL,MOTRIN) 600 MG tablet Take 1 tablet (600 mg total) by mouth every 6 (six) hours as needed. 02/01/18   Linwood Dibbles, MD  Lacosamide (VIMPAT) 100 MG TABS Take 100 mg by mouth daily.     [provider]    levETIRAcetam (KEPPRA) 500 MG tablet Take 500 mg by mouth daily.    [provider]  ondansetron (ZOFRAN) 4 MG tablet Take 1 tablet (4 mg total) by mouth every 6 (six) hours as needed for nausea, vomiting or refractory nausea / vomiting. Patient not taking: Reported on 02/01/2018 01/16/17   Shaune Pollack, MD    Allergies Morphine and related  No family history on file.  Social History Social History   Tobacco Use  . Smoking status: Current Every Day Smoker    Packs/day: 6.00    Types: Cigarettes  . Smokeless tobacco: Never Used  Substance Use Topics  . Alcohol use: Yes  . Drug use: No    Review of Systems  Constitutional: No fever/positive for chills Eyes: No visual changes. ENT: No sore throat. Cardiovascular: Denies chest pain. Respiratory: Denies shortness of breath. Gastrointestinal: Positive for abdominal pain, nausea, vomiting, and diarrhea genitourinary: Negative for dysuria. Musculoskeletal: Negative for back pain. Skin: Negative for rash. Neurological: Negative for headaches, focal weakness or numbness. ____________________________________________   PHYSICAL EXAM:  VITAL SIGNS: ED Triage Vitals [05/08/18 1744]  Enc Vitals Group     BP (!) 150/75     Pulse Rate 74     Resp 16     Temp 98.4 F (36.9 C)     Temp Source Oral     SpO2 100 %     Weight 149 lb 14.6 oz (68 kg)  Height 5\' 10"  (1.778 m)     Head Circumference      Peak Flow      Pain Score 10     Pain Loc      Pain Edu?      Excl. in GC?     Constitutional: Alert and oriented.  Ill appearing and in no acute distress. Eyes: Conjunctivae are normal. Head: Atraumatic. Nose: No congestion/rhinnorhea. Mouth/Throat: Mucous membranes are dry.   Neck: No stridor.   Cardiovascular: Normal rate, regular rhythm. Grossly normal heart sounds.  Good peripheral circulation. Respiratory: Normal respiratory effort.  No retractions. Lungs CTAB. Gastrointestinal: Soft.  Diffusely tender over the  right upper, right lower, and left lower abdomen. No distention. No abdominal bruits. Musculoskeletal: No lower extremity tenderness nor edema.  No joint effusions. Neurologic:  Normal speech and language. No gross focal neurologic deficits are appreciated. Skin:  Skin is warm, dry and intact. No rash noted. Psychiatric: Mood and affect are flat. Speech and behavior are normal.  ____________________________________________   LABS (all labs ordered are listed, but only abnormal results are displayed)  Labs Reviewed  COMPREHENSIVE METABOLIC PANEL - Abnormal; Notable for the following components:      Result Value   Glucose, Bld 107 (*)    Total Protein 9.4 (*)    Albumin 5.4 (*)    All other components within normal limits  CBC - Abnormal; Notable for the following components:   WBC 12.4 (*)    All other components within normal limits  URINALYSIS, COMPLETE (UACMP) WITH MICROSCOPIC - Abnormal; Notable for the following components:   Color, Urine AMBER (*)    APPearance CLEAR (*)    Bilirubin Urine SMALL (*)    Ketones, ur 80 (*)    Protein, ur 30 (*)    All other components within normal limits  LIPASE, BLOOD  LEVETIRACETAM LEVEL   ____________________________________________  EKG  Not indicated ____________________________________________  RADIOLOGY  ED MD interpretation: Pending upon transfer patient to room 12  Official radiology report(s): Ct Abdomen Pelvis W Contrast  Result Date: 05/08/2018 CLINICAL DATA:  Nausea and vomiting with diarrhea 3-4 days as well as nasal congestion. EXAM: CT ABDOMEN AND PELVIS WITH CONTRAST TECHNIQUE: Multidetector CT imaging of the abdomen and pelvis was performed using the standard protocol following bolus administration of intravenous contrast. CONTRAST:  OMNIPAQUE IOHEXOL 300 MG/ML  SOLN COMPARISON:  01/15/2017 FINDINGS: Lower chest: Normal. Hepatobiliary: Liver and biliary tree are normal. Couple tiny possible cholesterol  gallstones. Pancreas: Normal. Spleen: Normal. Adrenals/Urinary Tract: Adrenal glands are normal. Kidneys normal in size with punctate nonobstructing stone over the lower pole collecting system of the left kidney. Ureters and bladder are normal. Stomach/Bowel: Stomach and small bowel are normal. Appendix is normal. Mild wall thickening of the splenic flexure and descending colon without adjacent free fluid or inflammatory change. Findings may be due to mild colitis. Vascular/Lymphatic: Normal. Reproductive: Normal. Other: No free fluid or focal inflammatory change. Musculoskeletal: Within normal. IMPRESSION: Minimal wall thickening of the splenic flexure and descending colon without adjacent inflammatory change or free fluid. Findings may be due to mild acute colitis of infectious or inflammatory nature. Nonobstructing punctate stone over the lower pole left kidney. Mild cholelithiasis. Electronically Signed   By: Elberta Fortis M.D.   On: 05/08/2018 22:56    ____________________________________________   PROCEDURES  Procedure(s) performed: None  Procedures  Critical Care performed: No  ____________________________________________   INITIAL IMPRESSION / ASSESSMENT AND PLAN / ED COURSE  As  part of my medical decision making, I reviewed the following data within the electronic MEDICAL RECORD NUMBER    27 year old male presenting to the emergency department for treatment and evaluation after 3 days of nausea, vomiting, and diarrhea.  He has vomited multiple times while here in the emergency department.  He has received 2 L of fluid.  Initially, he received ODT Zofran which did not provide him any relief.  IV Phenergan was ordered and given with improvement for short time then the patient began vomiting again.  He has not had his Keppra or methadone in 3 days so he was given Ativan for anxiety and hope to prevent seizure-like activity while here.  He continued to vomit and was then given IV Zofran.  At  that point, the case was discussed with Dr. Cyril Loosen who recommended capsaicin be applied to the abdomen in case this is a cannabis hyperemesis, however the patient states that he does not smoke marijuana every day because it makes him feel as if he was going to have a seizure.  He did smoke it today however thinking that it may help control his nausea.  Patient was moved to room 12 for further evaluation and work-up.  Care relinquished to Dr. Cyril Loosen.      ____________________________________________   FINAL CLINICAL IMPRESSION(S) / ED DIAGNOSES  Final diagnoses:  Hyperemesis     ED Discharge Orders    None       Note:  This document was prepared using Dragon voice recognition software and may include unintentional dictation errors.    Chinita Pester, FNP 05/08/18 2348    Jene Every, MD 05/10/18 [phone]

## 2018-05-08 NOTE — ED Provider Notes (Signed)
Patient seen and evaluated by me, complains of nausea and vomiting which started approximately 3 days ago, this made it so that he was unable to keep down his methadone.  Question gastroenteritis leading to opioid withdrawal versus simple gastritis on his own as the cause of his symptoms.  Also a possibility of cannabis hyperemesis.  Patient has received IV fluids and multiple antiemetics.  He does describe pain primarily along the right side of his abdomen, will obtain imaging, give small dose of fentanyl as well as additional Phenergan and reevaluate   Jene Every, MD 05/08/18 2227

## 2018-05-08 NOTE — ED Notes (Signed)
Called and got report from [place], [place]

## 2018-05-08 NOTE — ED Notes (Signed)
Provider at bedside

## 2018-05-08 NOTE — ED Notes (Signed)
Pharmacy called to get cream ordered sent to ED.

## 2018-05-08 NOTE — ED Notes (Signed)
Pt threw up x1 in room.

## 2018-05-08 NOTE — ED Notes (Signed)
Pt states he has had Congestion for two weeks. Pt states he has not been able to eat anything for 3 days, as he has been throwing up for 3 days. Pt was able to drink Gatorade but states he later threw that up as well. Pt concerned that he threw up his seizure and sleeping medications. Pt stated he also had chills at home. Afebrile in triage. Pt in NAD at this time.

## 2018-05-08 NOTE — ED Notes (Addendum)
Patient currently in room vomiting.

## 2018-05-08 NOTE — ED Notes (Signed)
Pt vomiting again.

## 2018-05-08 NOTE — ED Triage Notes (Signed)
Patient reports that he has had nasal congestion as well as nausea and vomiting and diarrhea x3-4 days. States he is on seizure medication and takes methadone and has been unable to keep anything down for 3 days.

## 2018-05-08 NOTE — ED Notes (Signed)
Attempted to call report to receiving RN with no answer.

## 2018-05-08 NOTE — ED Notes (Signed)
Red top sent to lab at this time.  

## 2018-05-08 NOTE — ED Notes (Signed)
Pt heard vomiting in room before PO challenge could be attempted. PA [employer] notified.

## 2018-05-09 MED ORDER — ONDANSETRON 4 MG PO TBDP: ORAL_TABLET | ORAL | 0 refills | Status: DC

## 2018-05-09 NOTE — ED Provider Notes (Signed)
-----------------------------------------   1:02 AM on 05/09/2018 -----------------------------------------   Assuming care from Dr. Cyril Loosen.  In short, Trevor Thornton is a 27 y.o. male with a chief complaint of nausea, vomiting, and diarrhea.  Refer to the original H&P for additional details.  The current plan of care is to reassess for improvement of symptoms.    ----------------------------------------- 1:03 AM on 05/09/2018 -----------------------------------------  Patient has been sleeping with no persistent symptoms.  Was able to tolerate PO challenge.  Will be discharged for outpatient follow up.  Gave usual/customary return precautions.   [name], MD 05/09/18 [phone]

## 2018-05-09 NOTE — Discharge Instructions (Signed)

## 2018-05-10 LAB — LEVETIRACETAM LEVEL: Levetiracetam Lvl: 5.5 ug/mL — ABNORMAL LOW (ref 10.0–40.0)

## 2019-01-21 ENCOUNTER — Emergency Department
Admission: EM | Admit: 2019-01-21 | Discharge: 2019-01-22 | Disposition: A | Discharge status: Home / Self Care | Payer: Self-pay | Attending: Emergency Medicine | Admitting: Emergency Medicine

## 2019-01-21 ENCOUNTER — Other Ambulatory Visit: Payer: Self-pay

## 2019-01-21 ENCOUNTER — Emergency Department: Payer: Self-pay

## 2019-01-21 DIAGNOSIS — Z20828 Contact with and (suspected) exposure to other viral communicable diseases: Secondary | ICD-10-CM | POA: Insufficient documentation

## 2019-01-21 DIAGNOSIS — Z79899 Other long term (current) drug therapy: Secondary | ICD-10-CM | POA: Insufficient documentation

## 2019-01-21 DIAGNOSIS — J069 Acute upper respiratory infection, unspecified: Secondary | ICD-10-CM | POA: Insufficient documentation

## 2019-01-21 DIAGNOSIS — R112 Nausea with vomiting, unspecified: Secondary | ICD-10-CM | POA: Insufficient documentation

## 2019-01-21 DIAGNOSIS — F17210 Nicotine dependence, cigarettes, uncomplicated: Secondary | ICD-10-CM | POA: Insufficient documentation

## 2019-01-21 DIAGNOSIS — K0889 Other specified disorders of teeth and supporting structures: Secondary | ICD-10-CM

## 2019-01-21 LAB — COMPREHENSIVE METABOLIC PANEL
ALT: 43 U/L (ref 0–44)
AST: 35 U/L (ref 15–41)
Albumin: 4.4 g/dL (ref 3.5–5.0)
Alkaline Phosphatase: 92 U/L (ref 38–126)
Anion gap: 15 (ref 5–15)
BUN: 11 mg/dL (ref 6–20)
CO2: 20 mmol/L — ABNORMAL LOW (ref 22–32)
Calcium: 9.3 mg/dL (ref 8.9–10.3)
Chloride: 102 mmol/L (ref 98–111)
Creatinine, Ser: 0.79 mg/dL (ref 0.61–1.24)
GFR calc Af Amer: >60 mL/min (ref >60)
GFR calc non Af Amer: >60 mL/min (ref >60)
Glucose, Bld: 109 mg/dL — ABNORMAL HIGH (ref 70–99)
Potassium: 3.5 mmol/L (ref 3.5–5.1)
Sodium: 137 mmol/L (ref 135–145)
Total Bilirubin: 0.8 mg/dL (ref 0.3–1.2)
Total Protein: 8.3 g/dL — ABNORMAL HIGH (ref 6.5–8.1)

## 2019-01-21 LAB — CBC
HCT: 45.2 % (ref 39.0–52.0)
Hemoglobin: 14.7 g/dL (ref 13.0–17.0)
MCH: 28.7 pg (ref 26.0–34.0)
MCHC: 32.5 g/dL (ref 30.0–36.0)
MCV: 88.3 fL (ref 80.0–100.0)
Platelets: 232 10*3/uL (ref 150–400)
RBC: 5.12 MIL/uL (ref 4.22–5.81)
RDW: 14.4 % (ref 11.5–15.5)
WBC: 10 10*3/uL (ref 4.0–10.5)
nRBC: 0 % (ref 0.0–0.2)

## 2019-01-21 MED ORDER — ONDANSETRON 4 MG PO TBDP: 4.0000 mg | ORAL_TABLET | Freq: Three times a day (TID) | ORAL | 0 refills | Status: DC | PRN

## 2019-01-21 MED ORDER — ONDANSETRON HCL 4 MG/2ML IJ SOLN
4.0000 mg | Freq: Once | INTRAMUSCULAR | Status: AC
  Administered 2019-01-21: 4 mg via INTRAVENOUS
  Filled 2019-01-21: qty 2

## 2019-01-21 MED ORDER — SODIUM CHLORIDE 0.9 % IV BOLUS
1000.0000 mL | Freq: Once | INTRAVENOUS | Status: AC
  Administered 2019-01-21: 1000 mL via INTRAVENOUS

## 2019-01-21 MED ORDER — AMOXICILLIN-POT CLAVULANATE 875-125 MG PO TABS: 1.0000 | ORAL_TABLET | Freq: Two times a day (BID) | ORAL | 0 refills | Status: AC

## 2019-01-21 NOTE — ED Provider Notes (Signed)
[facility] Emergency Department Provider Note  Time seen: 10:46 PM  I have reviewed the triage vital signs and the nursing notes.   HISTORY  Chief Complaint Fever, cough  HPI Trevor Thornton is a 28 y.o. male with a past medical history of substance abuse currently takes methadone, states for the past 3 days has been experiencing increased pain in his left upper face due to a broken/infected tooth.  States he has been very nauseated with frequent episodes of vomiting unable to keep down fluids over the past 2 days.  Tried to take a friend's amoxicillin for the tooth infection but has been unable to keep it down.  Patient does take methadone on a daily basis.  States he is also been feeling somewhat short of breath with congestion and sore throat.   Past Medical History:  Diagnosis Date  . Seizures ([place])   . Seizures ([place])   . Substance abuse [facility])     Patient Active Problem List   Diagnosis Date Noted  . Infectious colitis, enteritis, and gastroenteritis   . Gallstones   . Abdominal pain   . Dehydration 01/14/2017    History reviewed. No pertinent surgical history.  Prior to Admission medications   Medication Sig Start Date End Date Taking? Authorizing Provider  albuterol (PROVENTIL HFA;VENTOLIN HFA) 108 (90 BASE) MCG/ACT inhaler Inhale 1-2 puffs into the lungs every 6 (six) hours as needed for wheezing or shortness of breath.    [provider]  ciprofloxacin (CIPRO) 500 MG tablet Take 1 tablet (500 mg total) by mouth 2 (two) times daily. Patient not taking: Reported on 02/01/2018 01/16/17   Demetrios Loll, MD  ibuprofen (ADVIL,MOTRIN) 600 MG tablet Take 1 tablet (600 mg total) by mouth every 6 (six) hours as needed. 02/01/18   Dorie Rank, MD  Lacosamide (VIMPAT) 100 MG TABS Take 100 mg by mouth daily.     [provider]  levETIRAcetam (KEPPRA) 500 MG tablet Take 500 mg by mouth daily.    [provider]  ondansetron (ZOFRAN ODT) 4 MG  disintegrating tablet Allow 1-2 tablets to dissolve in your mouth every 8 hours as needed for nausea/vomiting 05/09/18   Hinda Kehr, MD  ondansetron (ZOFRAN) 4 MG tablet Take 1 tablet (4 mg total) by mouth every 6 (six) hours as needed for nausea, vomiting or refractory nausea / vomiting. Patient not taking: Reported on 02/01/2018 01/16/17   Demetrios Loll, MD    Allergies  Allergen Reactions  . Morphine And Related Hives    No family history on file.  Social History Social History   Tobacco Use  . Smoking status: Current Every Day Smoker    Packs/day: 1.00    Types: Cigarettes  . Smokeless tobacco: Never Used  Substance Use Topics  . Alcohol use: Yes    Comment: friday 5 shots of rum  . Drug use: Yes    Types: Marijuana    Review of Systems Constitutional: Positive for fever x3 days ENT: Positive for congestion and sore throat Cardiovascular: Negative for chest pain. Respiratory: Mild shortness of breath.  Positive for cough. Gastrointestinal: Negative for abdominal pain, vomiting  Genitourinary: Negative for urinary compaints  Musculoskeletal: Negative for musculoskeletal complaints Skin: Negative for skin complaints  Neurological: Negative for headache All other ROS negative  ____________________________________________   PHYSICAL EXAM:  VITAL SIGNS: ED Triage Vitals  Enc Vitals Group     BP 01/21/19 1729 (!) 149/79     Pulse Rate 01/21/19 1729 Marland Kitchen)  59     Resp 01/21/19 1729 18     Temp 01/21/19 1729 99.4 F (37.4 C)     Temp Source 01/21/19 1729 Oral     SpO2 01/21/19 1729 99 %     Weight 01/21/19 1730 180 lb (81.6 kg)     Height 01/21/19 1730 5\' 10"  (1.778 m)     Head Circumference --      Peak Flow --      Pain Score 01/21/19 1730 9     Pain Loc --      Pain Edu? --      Excl. in GC? --    Constitutional: Alert and oriented. Well appearing and in no distress. Eyes: Normal exam ENT      Head: Normocephalic and atraumatic.      Mouth/Throat: Mucous  membranes are moist.  Overall poor dentition patient does have a fractured/infected left upper lateral incisor, no sign of abscess. Cardiovascular: Normal rate, regular rhythm. No murmur Respiratory: Normal respiratory effort without tachypnea nor retractions. Breath sounds are clear.  Occasional cough during exam. Gastrointestinal: Soft and nontender. No distention.   Musculoskeletal: Nontender with normal range of motion in all extremities.  Neurologic:  Normal speech and language. No gross focal neurologic deficits Skin:  Skin is warm, dry Psychiatric: Mood and affect are normal. Speech and behavior are normal.   ____________________________________________    RADIOLOGY  Chest x-ray pending  ____________________________________________   INITIAL IMPRESSION / ASSESSMENT AND PLAN / ED COURSE  Pertinent labs & imaging results that were available during my care of the patient were reviewed by me and considered in my medical decision making (see chart for details).   Patient presents emergency department for cough, congestion, fever, nausea vomiting over the past 3 days and a painful left upper lateral incisor.  Differential would include dental infection, gastroenteritis, viral illness such as coronavirus.  We will check labs, chest x-ray, corona swab.  Patient understands we will not be using narcotic pain medicines or dosing methadone in the emergency department.  We will dose nausea medication and IV fluids while awaiting results.  Patient agreeable to plan of care.  Patient does have reassuring vitals but he is borderline febrile 99.4.   Trevor Thornton was evaluated in Emergency Department on 01/21/2019 for the symptoms described in the history of present illness. He was evaluated in the context of the global COVID-19 pandemic, which necessitated consideration that the patient might be at risk for infection with the SARS-CoV-2 virus that causes COVID-19. Institutional protocols and  algorithms that pertain to the evaluation of patients at risk for COVID-19 are in a state of rapid change based on information released by regulatory bodies including the CDC and federal and state organizations. These policies and algorithms were followed during the patient's care in the ED.  ____________________________________________   FINAL CLINICAL IMPRESSION(S) / ED DIAGNOSES  Fever Nausea vomiting Upper respiratory infection   Minna AntisPaduchowski, Naylah Cork, MD 01/21/19 2251

## 2019-01-21 NOTE — ED Notes (Signed)
On Friday pt had tooth pain and started taking amoxicillin that a friend gave him

## 2019-01-21 NOTE — ED Notes (Signed)
ED Provider at bedside. 

## 2019-01-21 NOTE — ED Triage Notes (Addendum)
Pt to the er for dental abcess. Pt was dx 3 days given antibiotics and methadone. Pt says he has SOB, headache and fever. Pt has been unable to keep fluids down for 3 days,. Pt appears to feel bad but VSS. Addendum:  Pt was not seen for dental pain but was taking someone elses antibiotics. Pt has multiple complaints r/t covid.

## 2019-01-21 NOTE — Discharge Instructions (Addendum)
Take your antibiotic and Zofran as prescribed.  Return to the ER for worsening symptoms, persistent vomiting, difficulty breathing or other concerns.

## 2019-01-22 LAB — SARS CORONAVIRUS 2 BY RT PCR (HOSPITAL ORDER, PERFORMED IN ~~LOC~~ HOSPITAL LAB): SARS Coronavirus 2: NEGATIVE

## 2019-01-22 MED ORDER — ONDANSETRON 4 MG PO TBDP: 4.0000 mg | ORAL_TABLET | Freq: Three times a day (TID) | ORAL | 0 refills | Status: DC | PRN

## 2019-01-22 MED ORDER — ONDANSETRON HCL 4 MG/2ML IJ SOLN
4.0000 mg | Freq: Once | INTRAMUSCULAR | Status: AC
  Administered 2019-01-22: 4 mg via INTRAVENOUS
  Filled 2019-01-22: qty 2

## 2019-01-22 NOTE — ED Provider Notes (Signed)
-----------------------------------------   1:42 AM on 01/22/2019 -----------------------------------------  Updated patient of test results.  He is feeling much better.  Prescriptions printed by previous provider.  Strict return precautions given.  Patient verbalizes understanding agrees with plan of care.   Paulette Blanch, MD 01/22/19 [phone]

## 2021-08-12 ENCOUNTER — Emergency Department (HOSPITAL_COMMUNITY)
Admission: EM | Admit: 2021-08-12 | Discharge: 2021-08-13 | Disposition: A | Discharge status: Home / Self Care | Payer: PRIVATE HEALTH INSURANCE | Attending: Emergency Medicine | Admitting: Emergency Medicine

## 2021-08-12 ENCOUNTER — Encounter (HOSPITAL_COMMUNITY): Payer: Self-pay | Admitting: Emergency Medicine

## 2021-08-12 ENCOUNTER — Emergency Department (HOSPITAL_COMMUNITY): Payer: PRIVATE HEALTH INSURANCE

## 2021-08-12 ENCOUNTER — Other Ambulatory Visit: Payer: Self-pay

## 2021-08-12 DIAGNOSIS — Y92410 Unspecified street and highway as the place of occurrence of the external cause: Secondary | ICD-10-CM | POA: Insufficient documentation

## 2021-08-12 DIAGNOSIS — S3992XA Unspecified injury of lower back, initial encounter: Secondary | ICD-10-CM | POA: Diagnosis present

## 2021-08-12 DIAGNOSIS — S32010A Wedge compression fracture of first lumbar vertebra, initial encounter for closed fracture: Secondary | ICD-10-CM

## 2021-08-12 DIAGNOSIS — S20319A Abrasion of unspecified front wall of thorax, initial encounter: Secondary | ICD-10-CM | POA: Diagnosis not present

## 2021-08-12 DIAGNOSIS — S301XXA Contusion of abdominal wall, initial encounter: Secondary | ICD-10-CM | POA: Diagnosis not present

## 2021-08-12 DIAGNOSIS — S22080A Wedge compression fracture of T11-T12 vertebra, initial encounter for closed fracture: Secondary | ICD-10-CM

## 2021-08-12 DIAGNOSIS — S32019A Unspecified fracture of first lumbar vertebra, initial encounter for closed fracture: Secondary | ICD-10-CM | POA: Diagnosis not present

## 2021-08-12 DIAGNOSIS — S22089A Unspecified fracture of T11-T12 vertebra, initial encounter for closed fracture: Secondary | ICD-10-CM | POA: Insufficient documentation

## 2021-08-12 LAB — CBC WITH DIFFERENTIAL/PLATELET
Abs Immature Granulocytes: 0.14 10*3/uL — ABNORMAL HIGH (ref 0.00–0.07)
Basophils Absolute: 0 10*3/uL (ref 0.0–0.1)
Basophils Relative: 0 %
Eosinophils Absolute: 0 10*3/uL (ref 0.0–0.5)
Eosinophils Relative: 0 %
HCT: 43.5 % (ref 39.0–52.0)
Hemoglobin: 14 g/dL (ref 13.0–17.0)
Immature Granulocytes: 1 %
Lymphocytes Relative: 10 %
Lymphs Abs: 1.2 10*3/uL (ref 0.7–4.0)
MCH: 28.6 pg (ref 26.0–34.0)
MCHC: 32.2 g/dL (ref 30.0–36.0)
MCV: 89 fL (ref 80.0–100.0)
Monocytes Absolute: 0.5 10*3/uL (ref 0.1–1.0)
Monocytes Relative: 4 %
Neutro Abs: 10.5 10*3/uL — ABNORMAL HIGH (ref 1.7–7.7)
Neutrophils Relative %: 85 %
Platelets: 229 10*3/uL (ref 150–400)
RBC: 4.89 MIL/uL (ref 4.22–5.81)
RDW: 15.1 % (ref 11.5–15.5)
WBC: 12.4 10*3/uL — ABNORMAL HIGH (ref 4.0–10.5)
nRBC: 0 % (ref 0.0–0.2)

## 2021-08-12 LAB — COMPREHENSIVE METABOLIC PANEL
ALT: 47 U/L — ABNORMAL HIGH (ref 0–44)
AST: 50 U/L — ABNORMAL HIGH (ref 15–41)
Albumin: 4.4 g/dL (ref 3.5–5.0)
Alkaline Phosphatase: 87 U/L (ref 38–126)
Anion gap: 12 (ref 5–15)
BUN: 10 mg/dL (ref 6–20)
CO2: 26 mmol/L (ref 22–32)
Calcium: 9.9 mg/dL (ref 8.9–10.3)
Chloride: 101 mmol/L (ref 98–111)
Creatinine, Ser: 1.02 mg/dL (ref 0.61–1.24)
GFR, Estimated: >60 mL/min (ref >60)
Glucose, Bld: 120 mg/dL — ABNORMAL HIGH (ref 70–99)
Potassium: 3.8 mmol/L (ref 3.5–5.1)
Sodium: 139 mmol/L (ref 135–145)
Total Bilirubin: 0.6 mg/dL (ref 0.3–1.2)
Total Protein: 8.7 g/dL — ABNORMAL HIGH (ref 6.5–8.1)

## 2021-08-12 MED ORDER — FENTANYL CITRATE PF 50 MCG/ML IJ SOSY
100.0000 ug | PREFILLED_SYRINGE | Freq: Once | INTRAMUSCULAR | Status: AC
  Administered 2021-08-12: 100 ug via INTRAVENOUS
  Filled 2021-08-12: qty 2

## 2021-08-12 MED ORDER — LIDOCAINE 5 % EX PTCH: 1.0000 | MEDICATED_PATCH | CUTANEOUS | 0 refills | Status: DC

## 2021-08-12 MED ORDER — IOHEXOL 350 MG/ML SOLN
100.0000 mL | Freq: Once | INTRAVENOUS | Status: AC | PRN
  Administered 2021-08-12: 100 mL via INTRAVENOUS

## 2021-08-12 MED ORDER — IBUPROFEN 800 MG PO TABS: 800.0000 mg | ORAL_TABLET | Freq: Three times a day (TID) | ORAL | 0 refills | Status: AC

## 2021-08-12 NOTE — Progress Notes (Signed)
Orthopedic Tech Progress Note Patient Details:  Trevor Thornton Oct 21, 1990 [id number]  Ortho Devices Type of Ortho Device: Lumbar corsett Ortho Device/Splint Interventions: Ordered, Application, Adjustment   Post Interventions Patient Tolerated: Well Instructions Provided: Adjustment of device  [name] [name] 08/12/2021, 4:06 PM Applied lso

## 2021-08-12 NOTE — ED Notes (Signed)
Patient transported to CT 
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
## 2021-08-12 NOTE — Discharge Instructions (Addendum)
Take ibuprofen 3 times daily.  Use Lidoderm patch over the affected area.  Follow-up with Dr. Cyndy Freeze in neurosurgery next week.

## 2021-11-04 DIAGNOSIS — S32010D Wedge compression fracture of first lumbar vertebra, subsequent encounter for fracture with routine healing: Secondary | ICD-10-CM | POA: Insufficient documentation

## 2021-12-09 DIAGNOSIS — B1920 Unspecified viral hepatitis C without hepatic coma: Secondary | ICD-10-CM | POA: Insufficient documentation

## 2022-03-15 ENCOUNTER — Encounter: Payer: Self-pay | Admitting: Infectious Diseases

## 2022-03-15 ENCOUNTER — Other Ambulatory Visit
Admission: RE | Admit: 2022-03-15 | Discharge: 2022-03-15 | Disposition: A | Discharge status: Home / Self Care | Payer: Commercial Managed Care - HMO | Attending: Infectious Diseases | Admitting: Infectious Diseases

## 2022-03-15 ENCOUNTER — Ambulatory Visit: Payer: Commercial Managed Care - HMO | Attending: Infectious Diseases | Admitting: Infectious Diseases

## 2022-03-15 VITALS — BP 125/82 | HR 105 | Temp 99.0°F | Ht 70.0 in | Wt 199.0 lb

## 2022-03-15 DIAGNOSIS — B182 Chronic viral hepatitis C: Secondary | ICD-10-CM | POA: Diagnosis present

## 2022-03-15 DIAGNOSIS — Z79899 Other long term (current) drug therapy: Secondary | ICD-10-CM | POA: Insufficient documentation

## 2022-03-15 DIAGNOSIS — M4855XA Collapsed vertebra, not elsewhere classified, thoracolumbar region, initial encounter for fracture: Secondary | ICD-10-CM | POA: Diagnosis not present

## 2022-03-15 DIAGNOSIS — F1990 Other psychoactive substance use, unspecified, uncomplicated: Secondary | ICD-10-CM | POA: Insufficient documentation

## 2022-03-15 DIAGNOSIS — R569 Unspecified convulsions: Secondary | ICD-10-CM | POA: Diagnosis not present

## 2022-03-15 LAB — CBC WITH DIFFERENTIAL/PLATELET
Abs Immature Granulocytes: 0.02 10*3/uL (ref 0.00–0.07)
Basophils Absolute: 0 10*3/uL (ref 0.0–0.1)
Basophils Relative: 1 %
Eosinophils Absolute: 0.1 10*3/uL (ref 0.0–0.5)
Eosinophils Relative: 2 %
HCT: 43.1 % (ref 39.0–52.0)
Hemoglobin: 14.4 g/dL (ref 13.0–17.0)
Immature Granulocytes: 0 %
Lymphocytes Relative: 27 %
Lymphs Abs: 1.7 10*3/uL (ref 0.7–4.0)
MCH: 28.5 pg (ref 26.0–34.0)
MCHC: 33.4 g/dL (ref 30.0–36.0)
MCV: 85.2 fL (ref 80.0–100.0)
Monocytes Absolute: 0.2 10*3/uL (ref 0.1–1.0)
Monocytes Relative: 4 %
Neutro Abs: 4.2 10*3/uL (ref 1.7–7.7)
Neutrophils Relative %: 66 %
Platelets: 213 10*3/uL (ref 150–400)
RBC: 5.06 MIL/uL (ref 4.22–5.81)
RDW: 14.6 % (ref 11.5–15.5)
WBC: 6.3 10*3/uL (ref 4.0–10.5)
nRBC: 0 % (ref 0.0–0.2)

## 2022-03-15 LAB — COMPREHENSIVE METABOLIC PANEL
ALT: 73 U/L — ABNORMAL HIGH (ref 0–44)
AST: 45 U/L — ABNORMAL HIGH (ref 15–41)
Albumin: 4.8 g/dL (ref 3.5–5.0)
Alkaline Phosphatase: 95 U/L (ref 38–126)
Anion gap: 11 (ref 5–15)
BUN: 19 mg/dL (ref 6–20)
CO2: 23 mmol/L (ref 22–32)
Calcium: 9.5 mg/dL (ref 8.9–10.3)
Chloride: 103 mmol/L (ref 98–111)
Creatinine, Ser: 0.88 mg/dL (ref 0.61–1.24)
GFR, Estimated: >60 mL/min (ref >60)
Glucose, Bld: 105 mg/dL — ABNORMAL HIGH (ref 70–99)
Potassium: 3.5 mmol/L (ref 3.5–5.1)
Sodium: 137 mmol/L (ref 135–145)
Total Bilirubin: 0.9 mg/dL (ref 0.3–1.2)
Total Protein: 9.2 g/dL — ABNORMAL HIGH (ref 6.5–8.1)

## 2022-03-15 LAB — HEPATITIS B CORE ANTIBODY, TOTAL: Hep B Core Total Ab: NONREACTIVE

## 2022-03-15 LAB — HEPATITIS A ANTIBODY, TOTAL: hep A Total Ab: REACTIVE — AB

## 2022-03-15 LAB — PROTIME-INR
INR: 1 (ref 0.8–1.2)
Prothrombin Time: 13.4 seconds (ref 11.4–15.2)

## 2022-03-15 LAB — APTT: aPTT: 34 seconds (ref 24–36)

## 2022-03-15 LAB — TSH: TSH: 1.344 u[IU]/mL (ref 0.350–4.500)

## 2022-03-15 MED ORDER — METHADONE HCL 5 MG PO TABS: 110.0000 mg | ORAL_TABLET | Freq: Every day | ORAL | Status: DC

## 2022-03-15 NOTE — Patient Instructions (Signed)
You are here to engage in [facility]  management- will do labs, will order ultrasound . As you are on dilantin you will not be able to take the HEP C medicine Epclusa or Mayvret- please talk to your neurologist to discuss alternate treatment to dilantin like vimpat
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
## 2022-03-16 LAB — HCV FIBROSURE
ALPHA 2-MACROGLOBULINS, QN: 133 mg/dL (ref 110–276)
ALT (SGPT) P5P: 82 IU/L — ABNORMAL HIGH (ref 0–55)
Apolipoprotein A-1: 104 mg/dL (ref 101–178)
Bilirubin, Total: 0.3 mg/dL (ref 0.0–1.2)
Fibrosis Score: 0.1 (ref 0.00–0.21)
GGT: 97 IU/L — ABNORMAL HIGH (ref 0–65)
Haptoglobin: 197 mg/dL (ref 17–317)
Necroinflammat Activity Score: 0.41 — ABNORMAL HIGH (ref 0.00–0.17)

## 2022-03-16 LAB — HCV RNA QUANT
HCV Quantitative Log: 6.176 log10 IU/mL (ref >1.70)
HCV Quantitative: 1500000 IU/mL (ref >50)

## 2022-03-16 LAB — AFP TUMOR MARKER: AFP, Serum, Tumor Marker: 2.5 ng/mL (ref 0.0–5.7)

## 2022-03-16 LAB — HIV-1 RNA QUANT-NO REFLEX-BLD
HIV 1 RNA Quant: <20 copies/mL
LOG10 HIV-1 RNA: UNDETERMINED log10copy/mL

## 2022-03-17 ENCOUNTER — Other Ambulatory Visit (HOSPITAL_COMMUNITY): Payer: Self-pay

## 2022-03-17 ENCOUNTER — Ambulatory Visit
Admission: RE | Admit: 2022-03-17 | Discharge: 2022-03-17 | Disposition: A | Discharge status: Home / Self Care | Payer: Commercial Managed Care - HMO | Source: Ambulatory Visit | Attending: Infectious Diseases | Admitting: Infectious Diseases

## 2022-03-17 ENCOUNTER — Encounter: Payer: Self-pay | Admitting: Infectious Diseases

## 2022-03-17 DIAGNOSIS — B182 Chronic viral hepatitis C: Secondary | ICD-10-CM | POA: Diagnosis present

## 2022-03-17 NOTE — Telephone Encounter (Signed)
Once the Dr let me know which medication I will start a PA.

## 2022-03-18 LAB — HEPATITIS C GENOTYPE: HCV Genotype: 3

## 2022-03-29 ENCOUNTER — Ambulatory Visit: Payer: Commercial Managed Care - HMO | Attending: Infectious Diseases | Admitting: Infectious Diseases

## 2022-03-29 DIAGNOSIS — Z79899 Other long term (current) drug therapy: Secondary | ICD-10-CM | POA: Diagnosis present

## 2022-03-29 DIAGNOSIS — R569 Unspecified convulsions: Secondary | ICD-10-CM | POA: Insufficient documentation

## 2022-03-29 DIAGNOSIS — Z20822 Contact with and (suspected) exposure to covid-19: Secondary | ICD-10-CM | POA: Diagnosis not present

## 2022-03-29 DIAGNOSIS — B182 Chronic viral hepatitis C: Secondary | ICD-10-CM
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
## 2022-05-05 ENCOUNTER — Encounter: Payer: Self-pay | Admitting: Infectious Diseases

## 2022-06-15 ENCOUNTER — Encounter: Payer: Self-pay | Admitting: Infectious Diseases

## 2022-06-28 ENCOUNTER — Telehealth: Payer: Self-pay

## 2022-06-28 ENCOUNTER — Ambulatory Visit: Payer: Commercial Managed Care - HMO | Attending: Infectious Diseases | Admitting: Infectious Diseases

## 2022-06-28 ENCOUNTER — Encounter: Payer: Self-pay | Admitting: Infectious Diseases

## 2022-06-28 ENCOUNTER — Other Ambulatory Visit (HOSPITAL_COMMUNITY): Payer: Self-pay

## 2022-06-28 VITALS — BP 146/76 | HR 88 | Temp 98.1°F | Ht 70.0 in | Wt 218.0 lb

## 2022-06-28 DIAGNOSIS — R569 Unspecified convulsions: Secondary | ICD-10-CM | POA: Diagnosis present

## 2022-06-28 DIAGNOSIS — B182 Chronic viral hepatitis C: Secondary | ICD-10-CM | POA: Diagnosis not present

## 2022-06-28 NOTE — Telephone Encounter (Signed)
[facility] Patient Advocate Encounter  Prior Authorization for Epclusa (Generic) has been approved.    PA# [id number] Effective dates: 06/28/22 through 09/20/22  Patients co-pay is $6,676.00.   I was able to get the patient a copay card , but pt will have to call [employer] at [phone] to verify income information.  Prescription will be able to fill at [facility].  [facility] will continue to follow.  Clearance Coots, CPhT Specialty Pharmacy Patient [facility] for Infectious Disease Phone: [phone] Fax:  [phone]

## 2022-06-28 NOTE — Patient Instructions (Signed)
You are here for follow up of HEPC- you have discontinued dilantin and hence you can be started on EPCLUSA 1 tablet for 12 weeks - needs clearance from insurance . You will get phone call from 2 people to discuss the insurance/or patient assistance  and another person will call regarding the medication  Will follow you 1 month after you start treatment

## 2022-06-28 NOTE — Telephone Encounter (Addendum)
[facility] Patient Advocate Encounter   Received notification from [facility] that prior authorization for Trevor Thornton is required.   PA submitted on 06/28/22 Key [id number] Status is pending    [facility] will continue to follow.   Clearance Coots, CPhT Specialty Pharmacy Patient [facility] for Infectious Disease Phone: [phone] Fax:  [phone]
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
## 2022-06-29 ENCOUNTER — Other Ambulatory Visit (HOSPITAL_COMMUNITY): Payer: Self-pay

## 2022-06-29 ENCOUNTER — Other Ambulatory Visit: Payer: Self-pay | Admitting: Pharmacist

## 2022-06-29 DIAGNOSIS — B182 Chronic viral hepatitis C: Secondary | ICD-10-CM

## 2022-06-29 MED ORDER — SOFOSBUVIR-VELPATASVIR 400-100 MG PO TABS
1.0000 | ORAL_TABLET | Freq: Every day | ORAL | 2 refills | Status: DC
  Filled 2022-06-29 – 2022-07-12 (×2): qty 28, 28d supply, fill #0
  Filled 2022-08-02: qty 28, 28d supply, fill #1
  Filled 2022-08-29 – 2022-09-09 (×4): qty 28, 28d supply, fill #2

## 2022-06-29 NOTE — Telephone Encounter (Signed)
Goodmorning, Right now it is not covering it but once the patient speaks to [employer] and give his information they will cover it . I will call [employer] today, then I will call the Patient to give him information .

## 2022-07-12 ENCOUNTER — Telehealth: Payer: Self-pay | Admitting: Pharmacist

## 2022-07-12 ENCOUNTER — Encounter: Payer: Self-pay | Admitting: Infectious Diseases

## 2022-07-12 ENCOUNTER — Other Ambulatory Visit: Payer: Self-pay

## 2022-07-12 ENCOUNTER — Other Ambulatory Visit (HOSPITAL_COMMUNITY): Payer: Self-pay

## 2022-07-12 NOTE — Telephone Encounter (Signed)
Patient is approved to receive Epclusa x 12 weeks for chronic Hepatitis C infection. Counseled patient to take Epclusa daily with or without food. Encouraged patient not to miss any doses and explained how their chance of cure could go down with each dose missed. Counseled patient on what to do if dose is missed - if it is closer to the missed dose take immediately; if closer to next dose skip dose and take the next dose at the usual time. Counseled patient on common side effects such as headache, fatigue, and nausea and that these normally decrease with time. I reviewed patient medications and found no drug interactions. He did state he occasionally takes omeprazole in the evening after eating greasy foods; he knows to take Epclusa with food in the morning. Discussed with patient that there are several drug interactions including acid suppressants. Instructed patient to call clinic if he wishes to start a new medication during course of therapy. Also advised patient to call if he experiences any side effects. Patient will follow-up with Dr. Delaine Lame in one month.  He will reach out to Dr. Delaine Lame for scheduling follow-up and completing HBV screening.  Alfonse Spruce, PharmD, CPP, BCIDP, [place] Clinical Pharmacist Practitioner Infectious [place] for Infectious Disease

## 2022-07-13 ENCOUNTER — Other Ambulatory Visit (HOSPITAL_COMMUNITY): Payer: Self-pay

## 2022-07-13 NOTE — Telephone Encounter (Signed)
Advised to start medication

## 2022-07-13 NOTE — Telephone Encounter (Signed)
Patient aware of appointment and patient asking if he can go ahead and start medication even if he has not had his Hep B labs done

## 2022-07-14 ENCOUNTER — Other Ambulatory Visit: Payer: Self-pay

## 2022-07-15 ENCOUNTER — Other Ambulatory Visit
Admission: RE | Admit: 2022-07-15 | Discharge: 2022-07-15 | Disposition: A | Discharge status: Home / Self Care | Payer: Medicaid Other | Attending: Infectious Diseases | Admitting: Infectious Diseases

## 2022-07-15 DIAGNOSIS — B182 Chronic viral hepatitis C: Secondary | ICD-10-CM

## 2022-07-15 LAB — HEPATITIS B SURFACE ANTIGEN: Hepatitis B Surface Ag: NONREACTIVE

## 2022-07-16 LAB — HEPATITIS B SURFACE ANTIBODY, QUANTITATIVE: Hep B S AB Quant (Post): <3.1 m[IU]/mL — ABNORMAL LOW (ref >9.9)

## 2022-08-01 ENCOUNTER — Other Ambulatory Visit (HOSPITAL_COMMUNITY): Payer: Self-pay

## 2022-08-02 ENCOUNTER — Other Ambulatory Visit: Payer: Self-pay

## 2022-08-02 ENCOUNTER — Other Ambulatory Visit (HOSPITAL_COMMUNITY): Payer: Self-pay

## 2022-08-03 ENCOUNTER — Other Ambulatory Visit: Payer: Self-pay

## 2022-08-03 ENCOUNTER — Other Ambulatory Visit (HOSPITAL_COMMUNITY): Payer: Self-pay

## 2022-08-23 ENCOUNTER — Telehealth: Payer: Commercial Managed Care - HMO | Admitting: Infectious Diseases

## 2022-08-25 ENCOUNTER — Other Ambulatory Visit (HOSPITAL_COMMUNITY): Payer: Self-pay

## 2022-08-29 ENCOUNTER — Other Ambulatory Visit (HOSPITAL_COMMUNITY): Payer: Self-pay

## 2022-08-31 ENCOUNTER — Other Ambulatory Visit (HOSPITAL_COMMUNITY): Payer: Self-pay

## 2022-09-01 ENCOUNTER — Other Ambulatory Visit (HOSPITAL_COMMUNITY): Payer: Self-pay

## 2022-09-01 ENCOUNTER — Telehealth: Payer: Self-pay | Admitting: Pharmacist

## 2022-09-01 NOTE — Telephone Encounter (Signed)
Per Butch Penny, patient has not paid any of his insurance premiums and thus will not be able to fill his Epclusa through his insurance at this time.  Alfonse Spruce, PharmD, CPP, BCIDP, [place] Clinical Pharmacist Practitioner Infectious [place] for Infectious Disease

## 2022-09-01 NOTE — Telephone Encounter (Signed)
Called patient to see if he can come here and do his labs. If he calls back I will set his appt up.

## 2022-09-01 NOTE — Telephone Encounter (Signed)
Patient have received 8 week supply so far.

## 2022-09-02 ENCOUNTER — Other Ambulatory Visit (HOSPITAL_COMMUNITY): Payer: Self-pay

## 2022-09-05 ENCOUNTER — Other Ambulatory Visit (HOSPITAL_COMMUNITY): Payer: Self-pay

## 2022-09-07 ENCOUNTER — Other Ambulatory Visit (HOSPITAL_COMMUNITY): Payer: Self-pay

## 2022-09-08 ENCOUNTER — Other Ambulatory Visit
Admission: RE | Admit: 2022-09-08 | Discharge: 2022-09-08 | Disposition: A | Discharge status: Home / Self Care | Payer: Medicaid Other | Source: Ambulatory Visit | Attending: Infectious Diseases | Admitting: Infectious Diseases

## 2022-09-08 ENCOUNTER — Encounter: Payer: Self-pay | Admitting: Infectious Diseases

## 2022-09-08 ENCOUNTER — Ambulatory Visit: Payer: Medicaid Other | Attending: Infectious Diseases | Admitting: Infectious Diseases

## 2022-09-08 ENCOUNTER — Other Ambulatory Visit (HOSPITAL_COMMUNITY): Payer: Self-pay

## 2022-09-08 VITALS — BP 157/83 | HR 75 | Temp 97.2°F | Ht 70.0 in | Wt 222.0 lb

## 2022-09-08 DIAGNOSIS — B182 Chronic viral hepatitis C: Secondary | ICD-10-CM | POA: Insufficient documentation

## 2022-09-08 DIAGNOSIS — R569 Unspecified convulsions: Secondary | ICD-10-CM | POA: Insufficient documentation

## 2022-09-08 DIAGNOSIS — Z79899 Other long term (current) drug therapy: Secondary | ICD-10-CM | POA: Diagnosis not present

## 2022-09-08 DIAGNOSIS — B1920 Unspecified viral hepatitis C without hepatic coma: Secondary | ICD-10-CM | POA: Insufficient documentation

## 2022-09-08 LAB — COMPREHENSIVE METABOLIC PANEL
ALT: 18 U/L (ref 0–44)
AST: 25 U/L (ref 15–41)
Albumin: 4.4 g/dL (ref 3.5–5.0)
Alkaline Phosphatase: 69 U/L (ref 38–126)
Anion gap: 9 (ref 5–15)
BUN: 10 mg/dL (ref 6–20)
CO2: 27 mmol/L (ref 22–32)
Calcium: 9.2 mg/dL (ref 8.9–10.3)
Chloride: 102 mmol/L (ref 98–111)
Creatinine, Ser: 1.03 mg/dL (ref 0.61–1.24)
GFR, Estimated: >60 mL/min (ref >60)
Glucose, Bld: 90 mg/dL (ref 70–99)
Potassium: 3.4 mmol/L — ABNORMAL LOW (ref 3.5–5.1)
Sodium: 138 mmol/L (ref 135–145)
Total Bilirubin: 0.5 mg/dL (ref 0.3–1.2)
Total Protein: 8.3 g/dL — ABNORMAL HIGH (ref 6.5–8.1)
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
## 2022-09-09 ENCOUNTER — Other Ambulatory Visit (HOSPITAL_COMMUNITY): Payer: Self-pay

## 2022-09-10 LAB — HCV RNA QUANT: HCV Quantitative: NOT DETECTED IU/mL (ref >50)

## 2022-09-19 ENCOUNTER — Telehealth: Payer: Self-pay

## 2022-09-19 NOTE — Telephone Encounter (Signed)
-----   Message from Tsosie Billing, MD sent at 09/16/2022  3:52 PM EST ----- Please let him know when you get a chance that HCV was not detected and he should continue Epclusa until completion. thx ----- Message ----- From: Buel Ream, Lab In [place] Sent: 09/08/2022  10:34 AM EST To: Tsosie Billing, MD

## 2022-09-19 NOTE — Telephone Encounter (Signed)
Patient advised of lab results and verbalized understanding. Kaylani Fromme T Phyllis Abelson  

## 2022-09-22 ENCOUNTER — Other Ambulatory Visit (HOSPITAL_COMMUNITY): Payer: Self-pay

## 2023-01-31 ENCOUNTER — Other Ambulatory Visit: Payer: Self-pay

## 2023-01-31 ENCOUNTER — Emergency Department: Payer: MEDICAID

## 2023-01-31 ENCOUNTER — Emergency Department
Admission: EM | Admit: 2023-01-31 | Discharge: 2023-01-31 | Disposition: A | Discharge status: Home / Self Care | Payer: MEDICAID | Attending: Emergency Medicine | Admitting: Emergency Medicine

## 2023-01-31 DIAGNOSIS — L02414 Cutaneous abscess of left upper limb: Secondary | ICD-10-CM | POA: Diagnosis not present

## 2023-01-31 DIAGNOSIS — L0291 Cutaneous abscess, unspecified: Secondary | ICD-10-CM

## 2023-01-31 LAB — CBC WITH DIFFERENTIAL/PLATELET
Abs Immature Granulocytes: 0.03 10*3/uL (ref 0.00–0.07)
Basophils Absolute: 0 10*3/uL (ref 0.0–0.1)
Basophils Relative: 0 %
Eosinophils Absolute: 0 10*3/uL (ref 0.0–0.5)
Eosinophils Relative: 0 %
HCT: 39.4 % (ref 39.0–52.0)
Hemoglobin: 13 g/dL (ref 13.0–17.0)
Immature Granulocytes: 1 %
Lymphocytes Relative: 17 %
Lymphs Abs: 1 10*3/uL (ref 0.7–4.0)
MCH: 28.6 pg (ref 26.0–34.0)
MCHC: 33 g/dL (ref 30.0–36.0)
MCV: 86.8 fL (ref 80.0–100.0)
Monocytes Absolute: 0.2 10*3/uL (ref 0.1–1.0)
Monocytes Relative: 4 %
Neutro Abs: 4.5 10*3/uL (ref 1.7–7.7)
Neutrophils Relative %: 78 %
Platelets: 174 10*3/uL (ref 150–400)
RBC: 4.54 MIL/uL (ref 4.22–5.81)
RDW: 15.6 % — ABNORMAL HIGH (ref 11.5–15.5)
WBC: 5.8 10*3/uL (ref 4.0–10.5)
nRBC: 0 % (ref 0.0–0.2)

## 2023-01-31 LAB — BASIC METABOLIC PANEL
Anion gap: 7 (ref 5–15)
BUN: 14 mg/dL (ref 6–20)
CO2: 24 mmol/L (ref 22–32)
Calcium: 9.1 mg/dL (ref 8.9–10.3)
Chloride: 103 mmol/L (ref 98–111)
Creatinine, Ser: 0.89 mg/dL (ref 0.61–1.24)
GFR, Estimated: >60 mL/min (ref >60)
Glucose, Bld: 118 mg/dL — ABNORMAL HIGH (ref 70–99)
Potassium: 3.5 mmol/L (ref 3.5–5.1)
Sodium: 134 mmol/L — ABNORMAL LOW (ref 135–145)

## 2023-01-31 MED ORDER — SODIUM CHLORIDE 0.9 % IV BOLUS
1000.0000 mL | Freq: Once | INTRAVENOUS | Status: AC
  Administered 2023-01-31: 1000 mL via INTRAVENOUS

## 2023-01-31 MED ORDER — LIDOCAINE HCL (PF) 1 % IJ SOLN
5.0000 mL | Freq: Once | INTRAMUSCULAR | Status: AC
Start: 2023-01-31 — End: 2023-01-31
  Administered 2023-01-31: 5 mL via INTRADERMAL
  Filled 2023-01-31: qty 5

## 2023-01-31 MED ORDER — CLINDAMYCIN HCL 150 MG PO CAPS: 300.0000 mg | ORAL_CAPSULE | Freq: Three times a day (TID) | ORAL | 0 refills | Status: DC

## 2023-01-31 MED ORDER — PIPERACILLIN-TAZOBACTAM 3.375 G IVPB 30 MIN
3.3750 g | Freq: Once | INTRAVENOUS | Status: AC
Start: 2023-01-31 — End: 2023-01-31
  Administered 2023-01-31: 3.375 g via INTRAVENOUS
  Filled 2023-01-31: qty 50
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
## 2023-01-31 NOTE — ED Triage Notes (Addendum)
Pt arrives via POV w/ c/o abscess to L lower lateral arm. pt reports he has had it for about a week but hit it on something yesterday and it is now more swollen and painful.

## 2023-01-31 NOTE — ED Provider Notes (Signed)
[facility] Provider Note    Event Date/Time   First MD Initiated Contact with Patient 01/31/23 1300     (approximate)   History   Abscess   HPI  Trevor Thornton is a 32 y.o. male with history of seizures and substance abuse presents emergency department with a red swollen area on the left forearm.  Patient states he had been using heroin when he missed.  States it has been there for about a week but then hit his arm on something yesterday and became even more red and swollen.  Now has a streak into his hand and up his arm.  No fever or chills.      Physical Exam   Triage Vital Signs: ED Triage Vitals  Encounter Vitals Group     BP 01/31/23 1219 (!) 158/81     Systolic BP Percentile --      Diastolic BP Percentile --      Pulse Rate 01/31/23 1219 (!) 102     Resp 01/31/23 1219 16     Temp 01/31/23 1219 98.5 F (36.9 C)     Temp src --      SpO2 01/31/23 1219 99 %     Weight 01/31/23 1221 225 lb (102.1 kg)     Height 01/31/23 1221 5\' 10"  (1.778 m)     Head Circumference --      Peak Flow --      Pain Score 01/31/23 1220 1     Pain Loc --      Pain Education --      Exclude from Growth Chart --     Most recent vital signs: Vitals:   01/31/23 1219 01/31/23 1219  BP:  (!) 158/81  Pulse:  (!) 102  Resp:  16  Temp: 98.5 F (36.9 C) 98.5 F (36.9 C)  SpO2:  99%     General: Awake, no distress.   CV:  Good peripheral perfusion. regular rate and  rhythm Resp:  Normal effort.  Abd:  No distention.   Other:  Left forearm with a large amount of redness and swelling, several track marks noted, red streak noted into the left hand from the area of concern and redness extending up the forearm, neurovascular is intact   ED Results / Procedures / Treatments   Labs (all labs ordered are listed, but only abnormal results are displayed) Labs Reviewed  CBC WITH DIFFERENTIAL/PLATELET - Abnormal; Notable for the following components:      Result  Value   RDW 15.6 (*)    All other components within normal limits  BASIC METABOLIC PANEL - Abnormal; Notable for the following components:   Sodium 134 (*)    Glucose, Bld 118 (*)    All other components within normal limits     EKG     RADIOLOGY X-ray of the left forearm    PROCEDURES:   .Marland KitchenIncision and Drainage  Date/Time: 01/31/2023 2:36 PM  Performed by: Faythe Ghee, PA-C Authorized by: Faythe Ghee, PA-C   Consent:    Consent obtained:  Verbal   Consent given by:  Patient   Risks, benefits, and alternatives were discussed: yes     Risks discussed:  Bleeding, incomplete drainage, pain, infection and damage to other organs   Alternatives discussed:  Delayed treatment Universal protocol:    Procedure explained and questions answered to patient or proxy's satisfaction: yes     Immediately prior to procedure, a time out  was called: yes     Patient identity confirmed:  Verbally with patient Location:    Type:  Abscess   Location:  Upper extremity   Upper extremity location:  Arm   Arm location:  L lower arm Pre-procedure details:    Skin preparation:  Povidone-iodine Anesthesia:    Anesthesia method:  Local infiltration   Local anesthetic:  Lidocaine 1% w/o epi Procedure type:    Complexity:  Complex Procedure details:    Incision types:  Stab incision   Incision depth:  Dermal   Wound management:  Probed and deloculated and irrigated with saline   Drainage:  Purulent and bloody   Drainage amount:  Copious   Wound treatment:  Drain placed   Packing materials:  1/4 in iodoform gauze Post-procedure details:    Procedure completion:  Tolerated well, no immediate complications    MEDICATIONS ORDERED IN ED: Medications  sodium chloride 0.9 % bolus 1,000 mL (1,000 mLs Intravenous New Bag/Given 01/31/23 1331)  piperacillin-tazobactam (ZOSYN) IVPB 3.375 g (3.375 g Intravenous New Bag/Given 01/31/23 1331)  lidocaine (PF) (XYLOCAINE) 1 % injection 5 mL (5  mLs Intradermal Given by Other 01/31/23 1330)     IMPRESSION / MDM / ASSESSMENT AND PLAN / ED COURSE  I reviewed the triage vital signs and the nursing notes.                              Differential diagnosis includes, but is not limited to, cellulitis, abscess, sepsis  Patient's presentation is most consistent with acute presentation with potential threat to life or bodily function.   Labs, imaging, Zosyn IV antibiotics ordered, will attempt an incision and drainage.   Patient's labs are reassuring  X-ray of the left forearm does not show any gas pattern or tracking, no foreign bodies such as broken needles  See incision and drainage procedure note  Patient tolerated procedure well.  Packing was placed, dressing applied by nursing staff.  Patient was given a prescription for clindamycin.  Tylenol and ibuprofen for pain as needed.  Continue to see his provider at the methadone clinic.  He is to either return the emergency department in 2 days or see his regular doctor for packing removal.  Patient is in agreement treatment plan.  He was discharged stable condition.   FINAL CLINICAL IMPRESSION(S) / ED DIAGNOSES   Final diagnoses:  Abscess     Rx / DC Orders   ED Discharge Orders          Ordered    clindamycin (CLEOCIN) 150 MG capsule  3 times daily        01/31/23 1435             Note:  This document was prepared using Dragon voice recognition software and may include unintentional dictation errors.    Faythe Ghee, PA-C 01/31/23 1438    Pilar Jarvis, MD 01/31/23 [phone]

## 2023-02-02 ENCOUNTER — Encounter: Payer: Self-pay | Admitting: Emergency Medicine

## 2023-02-02 ENCOUNTER — Other Ambulatory Visit: Payer: Self-pay

## 2023-02-02 ENCOUNTER — Emergency Department
Admission: EM | Admit: 2023-02-02 | Discharge: 2023-02-02 | Disposition: A | Discharge status: Home / Self Care | Payer: MEDICAID | Attending: Emergency Medicine | Admitting: Emergency Medicine

## 2023-02-02 DIAGNOSIS — Z4800 Encounter for change or removal of nonsurgical wound dressing: Secondary | ICD-10-CM | POA: Diagnosis present

## 2023-02-02 DIAGNOSIS — Z5189 Encounter for other specified aftercare: Secondary | ICD-10-CM

## 2023-02-02 NOTE — ED Provider Notes (Signed)
[facility] Provider Note    Event Date/Time   First MD Initiated Contact with Patient 02/02/23 1214     (approximate)   History   Wound Check   HPI  Trevor Thornton is a 32 y.o. male with PMH of seizures and substance abuse who presents for a wound check.  Patient was seen by this ED on 01/31/2023 and had an abscess on his left forearm drained.  He was started on clindamycin which she reports he is still taking.  Patient is here to have the packing removed.     Physical Exam   Triage Vital Signs: ED Triage Vitals  Encounter Vitals Group     BP 02/02/23 1203 137/62     Systolic BP Percentile --      Diastolic BP Percentile --      Pulse --      Resp 02/02/23 1203 16     Temp 02/02/23 1203 98.2 F (36.8 C)     Temp Source 02/02/23 1203 Oral     SpO2 02/02/23 1203 97 %     Weight 02/02/23 1201 225 lb 1.4 oz (102.1 kg)     Height 02/02/23 1201 5\' 10"  (1.778 m)     Head Circumference --      Peak Flow --      Pain Score 02/02/23 1201 5     Pain Loc --      Pain Education --      Exclude from Growth Chart --     Most recent vital signs: Vitals:   02/02/23 1203  BP: 137/62  Resp: 16  Temp: 98.2 F (36.8 C)  SpO2: 97%     General: Awake, no distress.  CV:  Good peripheral perfusion.  Resp:  Normal effort.  Abd:  No distention.  Other:  Open wound to the ulnar side of the left forearm, healing well, no swelling, warmth, redness or streaking   ED Results / Procedures / Treatments   Labs (all labs ordered are listed, but only abnormal results are displayed) Labs Reviewed - No data to display   PROCEDURES:  Critical Care performed: No  Procedures   MEDICATIONS ORDERED IN ED: Medications - No data to display   IMPRESSION / MDM / ASSESSMENT AND PLAN / ED COURSE  I reviewed the triage vital signs and the nursing notes.                             32 year old male presents for evaluation of a wound check.  VSS in triage and  NAD on exam.  Differential diagnosis includes, but is not limited to, wound check, infection, abscess.  Patient's presentation is most consistent with acute, uncomplicated illness.  Packing was easily removed and showed bloody drainage.  Wound is no longer draining and did not bleed through the original dressing.  Wound was cleaned with saline and redressed with gauze and Coban.  I advised patient to clean it daily with soap and water.  He should change the dressing daily.  I stressed the importance of him continuing to take the antibiotic.  I explained signs of infection to look for and told him to return to the ED should he develop any of these.  Patient voiced understanding, all questions were answered and he was stable at discharge     FINAL CLINICAL IMPRESSION(S) / ED DIAGNOSES   Final diagnoses:  Visit for wound check  Rx / DC Orders   ED Discharge Orders     None        Note:  This document was prepared using Dragon voice recognition software and may include unintentional dictation errors.   Cameron Ali, PA-C 02/02/23 1235    Merwyn Katos, MD 02/02/23 [phone]

## 2023-02-02 NOTE — ED Triage Notes (Signed)
Pt here with a wound check. Pt has a wound to his left forearm that is wrapped in gauze. Pt is here for a follow up.

## 2023-02-02 NOTE — Discharge Instructions (Signed)
Please continue to take your antibiotic until you have finished all the medication.  Wash the wound with soap and water daily.  Change the dressing daily.  Please return to the ED if you develop any signs of infection including warmth, redness, swelling or pus draining from the wound.

## 2023-03-09 ENCOUNTER — Other Ambulatory Visit
Admission: RE | Admit: 2023-03-09 | Discharge: 2023-03-09 | Disposition: A | Discharge status: Home / Self Care | Payer: MEDICAID | Attending: Infectious Diseases | Admitting: Infectious Diseases

## 2023-03-09 ENCOUNTER — Encounter: Payer: Self-pay | Admitting: Infectious Diseases

## 2023-03-09 ENCOUNTER — Ambulatory Visit: Payer: MEDICAID | Attending: Infectious Diseases | Admitting: Infectious Diseases

## 2023-03-09 VITALS — BP 136/82 | HR 83 | Temp 98.1°F | Ht 70.0 in | Wt 216.0 lb

## 2023-03-09 DIAGNOSIS — Z23 Encounter for immunization: Secondary | ICD-10-CM | POA: Diagnosis not present

## 2023-03-09 DIAGNOSIS — F17210 Nicotine dependence, cigarettes, uncomplicated: Secondary | ICD-10-CM | POA: Insufficient documentation

## 2023-03-09 DIAGNOSIS — B182 Chronic viral hepatitis C: Secondary | ICD-10-CM | POA: Insufficient documentation

## 2023-03-09 DIAGNOSIS — Z79899 Other long term (current) drug therapy: Secondary | ICD-10-CM | POA: Diagnosis not present

## 2023-03-09 DIAGNOSIS — R569 Unspecified convulsions: Secondary | ICD-10-CM | POA: Insufficient documentation
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
## 2023-03-10 LAB — HCV RNA QUANT: HCV Quantitative: NOT DETECTED [IU]/mL (ref >50)

## 2023-03-14 ENCOUNTER — Telehealth: Payer: Self-pay

## 2023-03-14 NOTE — Telephone Encounter (Signed)
Patient informed of lab results and verbalized understanding.  Trevor Thornton  

## 2023-03-14 NOTE — Telephone Encounter (Signed)
-----   Message from Lynn Ito sent at 03/13/2023  4:17 PM EDT ----- Please let the patient know that HEPC RNA undetectable ----- Message ----- From: Leory Plowman, Lab In [place] Sent: 03/10/2023   6:35 PM EDT To: Lynn Ito, MD

## 2023-04-18 ENCOUNTER — Ambulatory Visit: Payer: MEDICAID

## 2023-04-20 ENCOUNTER — Ambulatory Visit: Payer: MEDICAID | Attending: Infectious Diseases

## 2023-04-20 DIAGNOSIS — Z23 Encounter for immunization: Secondary | ICD-10-CM

## 2023-06-12 ENCOUNTER — Ambulatory Visit: Payer: MEDICAID | Admitting: Nurse Practitioner

## 2023-06-12 ENCOUNTER — Encounter: Payer: Self-pay | Admitting: Nurse Practitioner

## 2023-06-12 VITALS — BP 114/80 | HR 89 | Temp 97.6°F | Resp 18 | Ht 70.0 in | Wt 208.2 lb

## 2023-06-12 DIAGNOSIS — R569 Unspecified convulsions: Secondary | ICD-10-CM | POA: Insufficient documentation

## 2023-06-12 DIAGNOSIS — M059 Rheumatoid arthritis with rheumatoid factor, unspecified: Secondary | ICD-10-CM | POA: Insufficient documentation

## 2023-06-12 DIAGNOSIS — Z23 Encounter for immunization: Secondary | ICD-10-CM

## 2023-06-12 DIAGNOSIS — Z7689 Persons encountering health services in other specified circumstances: Secondary | ICD-10-CM

## 2023-06-12 DIAGNOSIS — B182 Chronic viral hepatitis C: Secondary | ICD-10-CM | POA: Diagnosis not present
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
## 2023-06-15 ENCOUNTER — Other Ambulatory Visit: Payer: Self-pay | Admitting: Nurse Practitioner

## 2023-06-15 DIAGNOSIS — R569 Unspecified convulsions: Secondary | ICD-10-CM

## 2023-08-18 ENCOUNTER — Other Ambulatory Visit: Payer: Self-pay | Admitting: Nurse Practitioner

## 2023-08-18 ENCOUNTER — Encounter: Payer: Self-pay | Admitting: Nurse Practitioner

## 2023-08-18 DIAGNOSIS — R569 Unspecified convulsions: Secondary | ICD-10-CM

## 2023-08-18 MED ORDER — LEVETIRACETAM 500 MG PO TABS: 500.0000 mg | ORAL_TABLET | Freq: Every day | ORAL | 0 refills | Status: DC

## 2023-08-18 MED ORDER — QUETIAPINE FUMARATE 100 MG PO TABS: 100.0000 mg | ORAL_TABLET | Freq: Every day | ORAL | 0 refills | Status: DC

## 2023-08-18 MED ORDER — LACOSAMIDE 50 MG PO TABS: 50.0000 mg | ORAL_TABLET | Freq: Two times a day (BID) | ORAL | 0 refills | Status: DC

## 2023-08-23 ENCOUNTER — Other Ambulatory Visit: Payer: Self-pay | Admitting: Nurse Practitioner

## 2023-08-23 DIAGNOSIS — R569 Unspecified convulsions: Secondary | ICD-10-CM

## 2023-08-23 MED ORDER — LACOSAMIDE 150 MG PO TABS: 150.0000 mg | ORAL_TABLET | Freq: Two times a day (BID) | ORAL | 0 refills | Status: DC
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
## 2023-09-19 ENCOUNTER — Ambulatory Visit (INDEPENDENT_AMBULATORY_CARE_PROVIDER_SITE_OTHER): Payer: MEDICAID | Admitting: Neurology

## 2023-09-19 ENCOUNTER — Encounter: Payer: Self-pay | Admitting: Neurology

## 2023-09-19 VITALS — BP 126/78 | HR 95 | Ht 70.0 in | Wt 206.0 lb

## 2023-09-19 DIAGNOSIS — Z5181 Encounter for therapeutic drug level monitoring: Secondary | ICD-10-CM | POA: Diagnosis not present

## 2023-09-19 DIAGNOSIS — G40909 Epilepsy, unspecified, not intractable, without status epilepticus: Secondary | ICD-10-CM | POA: Diagnosis not present

## 2023-09-19 DIAGNOSIS — R569 Unspecified convulsions: Secondary | ICD-10-CM

## 2023-09-19 DIAGNOSIS — G4700 Insomnia, unspecified: Secondary | ICD-10-CM

## 2023-09-19 MED ORDER — LACOSAMIDE 150 MG PO TABS: 150.0000 mg | ORAL_TABLET | Freq: Two times a day (BID) | ORAL | 3 refills | Status: DC

## 2023-09-19 MED ORDER — QUETIAPINE FUMARATE 100 MG PO TABS: 100.0000 mg | ORAL_TABLET | Freq: Every day | ORAL | 3 refills | Status: DC

## 2023-09-19 MED ORDER — LEVETIRACETAM 500 MG PO TABS: 500.0000 mg | ORAL_TABLET | Freq: Every day | ORAL | 3 refills | Status: AC

## 2023-09-19 NOTE — Progress Notes (Addendum)
 [facility]  PATIENT: Trevor Thornton DOB: 04-05-1991  REQUESTING CLINICIAN: Quinton Buckler, FNP HISTORY FROM: Patient  REASON FOR VISIT: Seizure    HISTORICAL  CHIEF COMPLAINT:  Chief Complaint  Patient presents with   New Patient (Initial Visit)    Pt in 12, here alone  Pt is referred for seizures. Pt states he has not had any seizure activity in 3 years. Pt states he is compliant with all his medications.     HISTORY OF PRESENT ILLNESS:  This is a 33 year old gentleman past medical history of seizures, substance use, who is presenting for management of his seizures.  He tells me that his seizures started at the age of 33, described as generalized convulsion.  He used to see a neurologist in [place] but due to insurance, he no longer can follow-up there.  Tells me that his last seizure was 3 years ago currently he is on Keppra  500 mg twice daily, lacosamide  150 mg twice daily, tolerating medications well and has not had any additional seizure.  He is also on Seroquel  150 mg nightly for sleep and behavior.  Overall he is stable with his regimen and no seizures.   Handedness: Right handed   Onset: At the age of 33  Seizure Type: Described as generalized convulsion   Current frequency: Last seizure was more than 3 year ago   Any injuries from seizures: None   Seizure risk factors: Maternal Grandfather   Previous ASMs: Lacosamide , Levetiracetam    Currenty ASMs: Lacosamide  150 twice daily,  Keppra  500 twice daily   ASMs side effects: Denies   Brain Images: Normal head CT   Previous EEGs: Generalized epileptiform discharges    OTHER MEDICAL CONDITIONS: Epilepsy, Substance abuse   REVIEW OF SYSTEMS: Full 14 system review of systems performed and negative with exception of: As noted in the HPI   ALLERGIES: Allergies  Allergen Reactions   Morphine  And Codeine Hives    HOME MEDICATIONS: Outpatient Medications Prior to Visit  Medication Sig  Dispense Refill   albuterol  (PROVENTIL  HFA;VENTOLIN  HFA) 108 (90 BASE) MCG/ACT inhaler Inhale 1-2 puffs into the lungs every 6 (six) hours as needed for wheezing or shortness of breath.     ibuprofen  (ADVIL ) 800 MG tablet Take 1 tablet (800 mg total) by mouth 3 (three) times daily. 21 tablet 0   methadone  (DOLOPHINE ) 1 MG/1ML solution Take 5 mg by mouth every 6 (six) hours as needed.     ondansetron  (ZOFRAN ) 4 MG tablet Take 1 tablet (4 mg total) by mouth every 6 (six) hours as needed for nausea, vomiting or refractory nausea / vomiting. 12 tablet 0   lacosamide  150 MG TABS Take 1 tablet (150 mg total) by mouth 2 (two) times daily. 60 tablet 0   levETIRAcetam  (KEPPRA ) 500 MG tablet Take 1 tablet (500 mg total) by mouth daily. 30 tablet 0   QUEtiapine  (SEROQUEL ) 100 MG tablet Take 1 tablet (100 mg total) by mouth at bedtime. 30 tablet 0   No facility-administered medications prior to visit.    PAST MEDICAL HISTORY: Past Medical History:  Diagnosis Date   Seizures (HCC)    Seizures (HCC)    Substance abuse (HCC)     PAST SURGICAL HISTORY: History reviewed. No pertinent surgical history.  FAMILY HISTORY: History reviewed. No pertinent family history.  SOCIAL HISTORY: Social History   Socioeconomic History   Marital status: Single    Spouse name: Not on file   Number of children: Not  on file   Years of education: Not on file   Highest education level: Not on file  Occupational History   Not on file  Tobacco Use   Smoking status: Every Day    Current packs/day: 1.00    Types: Cigarettes   Smokeless tobacco: Never   Tobacco comments:    Pt states he smokes 1 pack a day   Vaping Use   Vaping status: Not on file  Substance and Sexual Activity   Alcohol use: Not Currently    Comment: friday 5 shots of rum   Drug use: Yes    Types: Marijuana, Cocaine    Comment: Heroin   Sexual activity: Not on file  Other Topics Concern   Not on file  Social History Narrative   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    Social Drivers of Corporate investment banker Strain: Not on file  Food Insecurity: Not on file  Transportation Needs: Not on file  Physical Activity: Not on file  Stress: Not on file  Social Connections: Not on file  Intimate Partner Violence: Not on file    PHYSICAL EXAM  GENERAL EXAM/CONSTITUTIONAL: Vitals:  Vitals:   09/19/23 1310  BP: 126/78  Pulse: 95  Weight: 206 lb (93.4 kg)  Height: 5' 10 (1.778 m)   Body mass index is 29.56 kg/m. Wt Readings from Last 3 Encounters:  09/19/23 206 lb (93.4 kg)  06/12/23 208 lb 3.2 oz (94.4 kg)  03/09/23 216 lb (98 kg)   Patient is in no distress; well developed, nourished and groomed; neck is supple  MUSCULOSKELETAL: Gait, strength, tone, movements noted in Neurologic exam below  NEUROLOGIC: MENTAL STATUS:      No data to display         awake, alert, oriented to person, place and time recent and remote memory intact normal attention and concentration language fluent, comprehension intact, naming intact fund of knowledge appropriate  CRANIAL NERVE:  2nd, 3rd, 4th, 6th - Visual fields full to confrontation, extraocular muscles intact, no nystagmus 5th - facial sensation symmetric 7th - facial strength symmetric 8th - hearing intact 9th - palate elevates symmetrically, uvula midline 11th - shoulder shrug symmetric 12th - tongue protrusion midline  MOTOR:  normal bulk and tone, full strength in the BUE, BLE  SENSORY:  normal and symmetric to light touch  COORDINATION:  finger-nose-finger, fine finger movements normal  GAIT/STATION:  normal   DIAGNOSTIC DATA (LABS, IMAGING, TESTING) - I reviewed patient records, labs, notes, testing and imaging myself where available.  Lab Results  Component Value Date   WBC 5.8 01/31/2023   HGB 13.0 01/31/2023   HCT 39.4 01/31/2023   MCV 86.8 01/31/2023   PLT 174 01/31/2023      Component Value Date/Time   NA 134 (L)  01/31/2023 1321   NA 138 05/12/2013 1407   K 3.5 01/31/2023 1321   K 3.4 (L) 05/12/2013 1407   CL 103 01/31/2023 1321   CL 107 05/12/2013 1407   CO2 24 01/31/2023 1321   CO2 28 05/12/2013 1407   GLUCOSE 118 (H) 01/31/2023 1321   GLUCOSE 100 (H) 05/12/2013 1407   BUN 14 01/31/2023 1321   BUN 8 05/12/2013 1407   CREATININE 0.89 01/31/2023 1321   CREATININE 1.08 05/12/2013 1407   CALCIUM 9.1 01/31/2023 1321   CALCIUM 8.8 05/12/2013 1407   PROT 8.3 (H) 09/08/2022 0950   PROT 6.4 03/03/2013 0601   ALBUMIN 4.4 09/08/2022 0950  ALBUMIN 3.5 03/03/2013 0601   AST 25 09/08/2022 0950   AST 64 (H) 03/03/2013 0601   ALT 18 09/08/2022 0950   ALT 39 03/03/2013 0601   ALKPHOS 69 09/08/2022 0950   ALKPHOS 62 03/03/2013 0601   BILITOT 0.5 09/08/2022 0950   BILITOT 1.3 (H) 03/03/2013 0601   GFRNONAA >60 01/31/2023 1321   GFRNONAA >60 05/12/2013 1407   GFRAA >60 01/21/2019 2308   GFRAA >60 05/12/2013 1407   No results found for: CHOL, HDL, LDLCALC, LDLDIRECT, TRIG No results found for: HGBA1C No results found for: VITAMINB12 Lab Results  Component Value Date   TSH 1.344 03/15/2022    Head CT 02/01/2018 Normal head CT.    3 hour EEG 01/19/2023 This 3 hour intermittently monitored video EEG in the awake and asleep states is abnormal due to occasional generalized epileptiform discharges. 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    ASSESSMENT AND PLAN  33 y.o. year old male  with history epilepsy, substance abuse who is presenting to establish care for his seizure.  He seizures are well-controlled with levetiracetam  and lacosamide , last seizure was reported 3 years ago.  Plan will be for patient to continue with levetiracetam  and lacosamide , refill given, I will also obtain antiseizure medication level.  Advised him to contact me if he does have a breakthrough seizure otherwise I will see him in 1 year for follow-up.   1. Seizure disorder (HCC)    2. Seizure-like activity (HCC)   3. Therapeutic drug monitoring     Patient Instructions  Continue Vimpat  lacosamide  150 mg twice daily Continue with Keppra  500 mg twice daily Refill given Will check antiseizure medication level with CMP Return in 1 year or sooner if worse   Per [place]  DMV statutes, patients with seizures are not allowed to drive until they have been seizure-free for six months.  Other recommendations include using caution when using heavy equipment or power tools. Avoid working on ladders or at heights. Take showers instead of baths.  Do not swim alone.  Ensure the water temperature is not too high on the home water heater. Do not go swimming alone. Do not lock yourself in a room alone (i.e. bathroom). When caring for infants or small children, sit down when holding, feeding, or changing them to minimize risk of injury to the child in the event you have a seizure. Maintain good sleep hygiene. Avoid alcohol.  Also recommend adequate sleep, hydration, good diet and minimize stress.   During the Seizure  - First, ensure adequate ventilation and place patients on the floor on their left side  Loosen clothing around the neck and ensure the airway is patent. If the patient is clenching the teeth, do not force the mouth open with any object as this can cause severe damage - Remove all items from the surrounding that can be hazardous. The patient may be oblivious to what's happening and may not even know what he or she is doing. If the patient is confused and wandering, either gently guide him/her away and block access to outside areas - Reassure the individual and be comforting - Call 911. In most cases, the seizure ends before EMS arrives. However, there are cases when seizures may last over 3 to 5 minutes. Or the individual may have developed breathing difficulties or severe injuries. If a pregnant patient or a person with diabetes develops a seizure, it is prudent to  call an ambulance. - Finally, if the  patient does not regain full consciousness, then call EMS. Most patients will remain confused for about 45 to 90 minutes after a seizure, so you must use judgment in calling for help. - Avoid restraints but make sure the patient is in a bed with padded side rails - Place the individual in a lateral position with the neck slightly flexed; this will help the saliva drain from the mouth and prevent the tongue from falling backward - Remove all nearby furniture and other hazards from the area - Provide verbal assurance as the individual is regaining consciousness - Provide the patient with privacy if possible - Call for help and start treatment as ordered by the caregiver   After the Seizure (Postictal Stage)  After a seizure, most patients experience confusion, fatigue, muscle pain and/or a headache. Thus, one should permit the individual to sleep. For the next few days, reassurance is essential. Being calm and helping reorient the person is also of importance.  Most seizures are painless and end spontaneously. Seizures are not harmful to others but can lead to complications such as stress on the lungs, brain and the heart. Individuals with prior lung problems may develop labored breathing and respiratory distress.    Discussed Patients with epilepsy have a small risk of sudden unexpected death, a condition referred to as sudden unexpected death in epilepsy (SUDEP). SUDEP is defined specifically as the sudden, unexpected, witnessed or unwitnessed, nontraumatic and nondrowning death in patients with epilepsy with or without evidence for a seizure, and excluding documented status epilepticus, in which post mortem examination does not reveal a structural or toxicologic cause for death     Orders Placed This Encounter  Procedures   Levetiracetam  level   Lacosamide    CMP    Meds ordered this encounter  Medications   levETIRAcetam  (KEPPRA ) 500 MG tablet     Sig: Take 1 tablet (500 mg total) by mouth daily.    Dispense:  90 tablet    Refill:  3   QUEtiapine  (SEROQUEL ) 100 MG tablet    Sig: Take 1 tablet (100 mg total) by mouth at bedtime.    Dispense:  90 tablet    Refill:  3   Lacosamide  150 MG TABS    Sig: Take 1 tablet (150 mg total) by mouth 2 (two) times daily.    Dispense:  180 tablet    Refill:  3    Return in about 1 year (around 09/18/2024).  12/21/2023 Continue with Seroquel  for Insomnia.     Cassandra Cleveland, MD 09/19/2023, 5:06 PM  [facility] [address], [address] [place], [place] [address] [phone]

## 2023-09-19 NOTE — Patient Instructions (Signed)
 Continue Vimpat lacosamide 150 mg twice daily Continue with Keppra 500 mg twice daily Refill given Will check antiseizure medication level with CMP Return in 1 year or sooner if worse

## 2023-09-23 LAB — COMPREHENSIVE METABOLIC PANEL
ALT: 46 IU/L — ABNORMAL HIGH (ref 0–44)
AST: 41 IU/L — ABNORMAL HIGH (ref 0–40)
Albumin: 4.1 g/dL (ref 4.1–5.1)
Alkaline Phosphatase: 152 IU/L — ABNORMAL HIGH (ref 44–121)
BUN/Creatinine Ratio: 7 — ABNORMAL LOW (ref 9–20)
BUN: 7 mg/dL (ref 6–20)
Bilirubin Total: 0.6 mg/dL (ref 0.0–1.2)
CO2: 21 mmol/L (ref 20–29)
Calcium: 9.1 mg/dL (ref 8.7–10.2)
Chloride: 98 mmol/L (ref 96–106)
Creatinine, Ser: 0.99 mg/dL (ref 0.76–1.27)
Globulin, Total: 4 g/dL (ref 1.5–4.5)
Glucose: 93 mg/dL (ref 70–99)
Potassium: 4 mmol/L (ref 3.5–5.2)
Sodium: 136 mmol/L (ref 134–144)
Total Protein: 8.1 g/dL (ref 6.0–8.5)
eGFR: 104 mL/min/{1.73_m2} (ref >59)

## 2023-09-23 LAB — LACOSAMIDE: Lacosamide: 9 ug/mL (ref 5.0–10.0)

## 2023-09-23 LAB — LEVETIRACETAM LEVEL: Levetiracetam Lvl: 12 ug/mL (ref 10.0–40.0)

## 2023-09-25 ENCOUNTER — Telehealth: Payer: Self-pay

## 2023-09-25 NOTE — Progress Notes (Signed)
 Please call and advise the patient that the recent labs we checked were within normal limits except for an elevated liver enzyme. Please follow up with your PCP to have liver enzyme recheck in a week. Please remind patient to keep any upcoming appointments or tests and to call us with any interim questions, concerns, problems or updates. Thanks,   Windell Norfolk, MD

## 2023-09-25 NOTE — Telephone Encounter (Signed)
 LVM for pt to call back for his results

## 2023-09-25 NOTE — Progress Notes (Signed)
 Elevated liver enzymes. I have advise patient to follow up with you to have liver enzyme recheck within a week.   Thank you  Dr. Teresa Coombs

## 2023-09-27 NOTE — Telephone Encounter (Signed)
 After several attempts and being unable to reach pt. Lab results were mailed to patient.

## 2023-10-18 ENCOUNTER — Encounter: Payer: Self-pay | Admitting: Nurse Practitioner

## 2023-10-18 ENCOUNTER — Encounter: Payer: Self-pay | Admitting: Neurology

## 2023-10-18 DIAGNOSIS — R569 Unspecified convulsions: Secondary | ICD-10-CM

## 2023-10-19 ENCOUNTER — Other Ambulatory Visit: Payer: Self-pay | Admitting: Neurology

## 2023-10-19 DIAGNOSIS — R569 Unspecified convulsions: Secondary | ICD-10-CM

## 2023-10-19 MED ORDER — QUETIAPINE FUMARATE 150 MG PO TABS: 150.0000 mg | ORAL_TABLET | Freq: Every day | ORAL | 11 refills | Status: AC

## 2023-12-11 ENCOUNTER — Ambulatory Visit: Payer: Self-pay | Admitting: Nurse Practitioner

## 2023-12-11 ENCOUNTER — Ambulatory Visit
Admission: RE | Admit: 2023-12-11 | Discharge: 2023-12-11 | Disposition: A | Discharge status: Home / Self Care | Payer: MEDICAID | Attending: Nurse Practitioner | Admitting: Nurse Practitioner

## 2023-12-11 ENCOUNTER — Other Ambulatory Visit: Payer: Self-pay | Admitting: Nurse Practitioner

## 2023-12-11 ENCOUNTER — Ambulatory Visit (INDEPENDENT_AMBULATORY_CARE_PROVIDER_SITE_OTHER): Payer: MEDICAID | Admitting: Nurse Practitioner

## 2023-12-11 ENCOUNTER — Ambulatory Visit
Admission: RE | Admit: 2023-12-11 | Discharge: 2023-12-11 | Disposition: A | Discharge status: Home / Self Care | Payer: MEDICAID | Source: Ambulatory Visit | Attending: Nurse Practitioner | Admitting: Nurse Practitioner

## 2023-12-11 VITALS — BP 124/72 | HR 98 | Temp 97.9°F | Ht 69.25 in | Wt 193.9 lb

## 2023-12-11 DIAGNOSIS — G8929 Other chronic pain: Secondary | ICD-10-CM

## 2023-12-11 DIAGNOSIS — M5136 Other intervertebral disc degeneration, lumbar region with discogenic back pain only: Secondary | ICD-10-CM

## 2023-12-11 DIAGNOSIS — M545 Low back pain, unspecified: Secondary | ICD-10-CM | POA: Diagnosis present

## 2023-12-11 MED ORDER — TIZANIDINE HCL 4 MG PO TABS: 2.0000 mg | ORAL_TABLET | Freq: Three times a day (TID) | ORAL | 0 refills | Status: DC | PRN
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
## 2023-12-19 ENCOUNTER — Encounter: Payer: Self-pay | Admitting: Neurology

## 2023-12-19 ENCOUNTER — Telehealth: Payer: Self-pay

## 2023-12-19 NOTE — Telephone Encounter (Signed)
 Call to [facility], spoke with Arzella Laurence, she states PA is needed for quetiapine /seroquel . She states they fax over another form, however, I sent urgently to the PA team. Left patient a message of the above info and may need to pay out of pocket for the month until approved. Left message to return call

## 2023-12-20 ENCOUNTER — Other Ambulatory Visit (HOSPITAL_COMMUNITY): Payer: Self-pay

## 2023-12-20 ENCOUNTER — Other Ambulatory Visit: Payer: Self-pay | Admitting: Neurology

## 2023-12-20 ENCOUNTER — Telehealth: Payer: Self-pay

## 2023-12-20 DIAGNOSIS — G4700 Insomnia, unspecified: Secondary | ICD-10-CM

## 2023-12-20 NOTE — Telephone Encounter (Signed)
 Seroquel  is for sleep (Insomnia)

## 2023-12-20 NOTE — Telephone Encounter (Signed)
 Received request to submit PA for Seroquel -please provide DX code-Thank you!

## 2023-12-21 NOTE — Telephone Encounter (Signed)
 Done

## 2023-12-21 NOTE — Telephone Encounter (Signed)
 Can you addend your note to add that diagnosis so we can complete pa?

## 2023-12-22 ENCOUNTER — Other Ambulatory Visit (HOSPITAL_COMMUNITY): Payer: Self-pay

## 2023-12-22 NOTE — Telephone Encounter (Signed)
 Pharmacy Patient Advocate Encounter   Received notification from Physician's Office that prior authorization for QUEtiapine  Fumarate 150MG  tablets is required/requested.   Insurance verification completed.   The patient is insured through [facility] .   Per test claim: PA required; PA submitted to above mentioned insurance via CoverMyMeds Key/confirmation #/EOC [id number] Status is pending

## 2023-12-22 NOTE — Telephone Encounter (Signed)
 Pharmacy Patient Advocate Encounter  Received notification from [facility] that Prior Authorization for QUEtiapine  Fumarate 150MG  tablets has been APPROVED from 12/22/2023 to 12/21/2024. Unable to obtain price due to refill too soon rejection, last fill date 12/22/2023 next available fill date7/11/2023   PA #/Case ID/Reference #: PA Case ID #: [id number]

## 2024-01-03 ENCOUNTER — Other Ambulatory Visit: Payer: Self-pay | Admitting: Nurse Practitioner

## 2024-01-03 DIAGNOSIS — G8929 Other chronic pain: Secondary | ICD-10-CM

## 2024-01-04 NOTE — Telephone Encounter (Signed)
 Requested medications are due for refill today.  yes  Requested medications are on the active medications list.  yes  Last refill. 12/11/2023 #90 0 rf  Future visit scheduled.   no  Notes to clinic.  Refill not delegated.    Requested Prescriptions  Pending Prescriptions Disp Refills   tiZANidine  (ZANAFLEX ) 4 MG tablet [Pharmacy Med Name: TIZANIDINE  HCL 4 MG TABLET] 270 tablet 1    Sig: TAKE 0.5-1.5 TABLETS BY MOUTH EVERY 8 HOURS AS NEEDED FOR MUSCLE SPASMS (MUSCLE TIGHTNESS).     Not Delegated - Cardiovascular:  Alpha-2 Agonists - tizanidine  Failed - 01/04/2024  2:47 PM      Failed - This refill cannot be delegated      Passed - Valid encounter within last 6 months    Recent Outpatient Visits           3 weeks ago Chronic bilateral low back pain without sciatica   [facility] Gareth Mliss FALCON, FNP       Future Appointments             In 2 months Fayette Bodily, MD [facility]

## 2024-01-05 ENCOUNTER — Other Ambulatory Visit: Payer: Self-pay

## 2024-01-05 ENCOUNTER — Emergency Department
Admission: EM | Admit: 2024-01-05 | Discharge: 2024-01-05 | Disposition: A | Discharge status: Home / Self Care | Payer: MEDICAID | Attending: Emergency Medicine | Admitting: Emergency Medicine

## 2024-01-05 DIAGNOSIS — L02414 Cutaneous abscess of left upper limb: Secondary | ICD-10-CM | POA: Diagnosis present

## 2024-01-05 LAB — BASIC METABOLIC PANEL WITH GFR
Anion gap: 10 (ref 5–15)
BUN: 16 mg/dL (ref 6–20)
CO2: 24 mmol/L (ref 22–32)
Calcium: 9.3 mg/dL (ref 8.9–10.3)
Chloride: 102 mmol/L (ref 98–111)
Creatinine, Ser: 0.95 mg/dL (ref 0.61–1.24)
GFR, Estimated: >60 mL/min (ref >60)
Glucose, Bld: 111 mg/dL — ABNORMAL HIGH (ref 70–99)
Potassium: 3.9 mmol/L (ref 3.5–5.1)
Sodium: 136 mmol/L (ref 135–145)

## 2024-01-05 LAB — CBC
HCT: 38.2 % — ABNORMAL LOW (ref 39.0–52.0)
Hemoglobin: 12.3 g/dL — ABNORMAL LOW (ref 13.0–17.0)
MCH: 26.1 pg (ref 26.0–34.0)
MCHC: 32.2 g/dL (ref 30.0–36.0)
MCV: 80.9 fL (ref 80.0–100.0)
Platelets: 165 10*3/uL (ref 150–400)
RBC: 4.72 MIL/uL (ref 4.22–5.81)
RDW: 16.8 % — ABNORMAL HIGH (ref 11.5–15.5)
WBC: 5.3 10*3/uL (ref 4.0–10.5)
nRBC: 0 % (ref 0.0–0.2)

## 2024-01-05 MED ORDER — CEPHALEXIN 500 MG PO CAPS: 500.0000 mg | ORAL_CAPSULE | Freq: Four times a day (QID) | ORAL | 0 refills | Status: AC

## 2024-01-05 MED ORDER — CEPHALEXIN 500 MG PO CAPS
500.0000 mg | ORAL_CAPSULE | Freq: Once | ORAL | Status: AC
  Administered 2024-01-05: 500 mg via ORAL
  Filled 2024-01-05: qty 1

## 2024-01-05 MED ORDER — DOXYCYCLINE HYCLATE 100 MG PO TABS
100.0000 mg | ORAL_TABLET | Freq: Once | ORAL | Status: AC
  Administered 2024-01-05: 100 mg via ORAL
  Filled 2024-01-05: qty 1

## 2024-01-05 MED ORDER — LIDOCAINE HCL (PF) 1 % IJ SOLN
5.0000 mL | Freq: Once | INTRAMUSCULAR | Status: DC
  Filled 2024-01-05: qty 5

## 2024-01-05 MED ORDER — DOXYCYCLINE HYCLATE 100 MG PO TABS: 100.0000 mg | ORAL_TABLET | Freq: Two times a day (BID) | ORAL | 0 refills | Status: AC

## 2024-01-05 NOTE — ED Provider Notes (Signed)
 [facility] Provider Note    Event Date/Time   First MD Initiated Contact with Patient 01/05/24 1127     (approximate)   History   Abscess   HPI  Trevor Thornton is a 33 year old male with history of seizures, substance use presenting to the emergency department for evaluation of forearm swelling.  A few days ago, patient began to redness and swelling along his forearm near the site where he recently injected heroin.  No fevers or chills.  Has had similar episodes in the past that have resolved with antibiotics.  Denies any broken needles, concerns for retained foreign body.      Physical Exam   Triage Vital Signs: ED Triage Vitals  Encounter Vitals Group     BP 01/05/24 1007 117/75     Girls Systolic BP Percentile --      Girls Diastolic BP Percentile --      Boys Systolic BP Percentile --      Boys Diastolic BP Percentile --      Pulse Rate 01/05/24 1007 (!) 122     Resp 01/05/24 1007 20     Temp 01/05/24 1007 98.9 F (37.2 C)     Temp Source 01/05/24 1007 Oral     SpO2 01/05/24 1007 100 %     Weight --      Height --      Head Circumference --      Peak Flow --      Pain Score 01/05/24 1005 8     Pain Loc --      Pain Education --      Exclude from Growth Chart --     Most recent vital signs: Vitals:   01/05/24 1007 01/05/24 1227  BP: 117/75 (!) 101/58  Pulse: (!) 122 84  Resp: 20 18  Temp: 98.9 F (37.2 C) (!) 97.4 F (36.3 C)  SpO2: 100% 98%     General: Awake, interactive  CV:  Regular rate with heart rate in the 90s at the time of my initial evaluation, good peripheral perfusion.  Resp:  Unlabored respirations.  Abd:  Nondistended.  Neuro:  Symmetric facial movement, fluid speech MSK:  There is a small area of erythema with overlying fluctuance over the left forearm.  There is a second smaller area of fluctuance more distally.  Under ultrasound, 2 well-circumscribed areas consistent with abscess noted.  A small vessel  noted centrally along the larger area of fluid collection, avoided during procedure below   ED Results / Procedures / Treatments   Labs (all labs ordered are listed, but only abnormal results are displayed) Labs Reviewed  CBC - Abnormal; Notable for the following components:      Result Value   Hemoglobin 12.3 (*)    HCT 38.2 (*)    RDW 16.8 (*)    All other components within normal limits  BASIC METABOLIC PANEL WITH GFR - Abnormal; Notable for the following components:   Glucose, Bld 111 (*)    All other components within normal limits     EKG EKG independently reviewed and interpreted by myself demonstrates:    RADIOLOGY Imaging independently reviewed and interpreted by myself demonstrates:   Formal Radiology Read:  No results found.  PROCEDURES:  Critical Care performed: No  .Incision and Drainage  Date/Time: 01/05/2024 12:27 PM  Performed by: Levander Slate, MD Authorized by: Levander Slate, MD   Consent:    Consent obtained:  Verbal   Consent  given by:  Patient   Risks, benefits, and alternatives were discussed: yes     Risks discussed:  Bleeding, incomplete drainage, pain, infection and damage to other organs   Alternatives discussed:  No treatment Location:    Type:  Abscess   Location:  Upper extremity   Upper extremity location:  Arm   Arm location:  L lower arm Pre-procedure details:    Skin preparation:  Chlorhexidine with alcohol Sedation:    Sedation type:  None Anesthesia:    Anesthesia method:  None Procedure type:    Complexity:  Complex Procedure details:    Ultrasound guidance: yes     Needle aspiration: no     Incision types:  Stab incision   Wound management:  Probed and deloculated   Drainage:  Purulent   Drainage amount:  Copious   Wound treatment:  Wound left open   Packing materials:  None Post-procedure details:    Procedure completion:  Tolerated Comments:     1 larger and 1 smaller abscess    MEDICATIONS ORDERED IN  ED: Medications  lidocaine  (PF) (XYLOCAINE ) 1 % injection 5 mL (has no administration in time range)  cephALEXin (KEFLEX) capsule 500 mg (has no administration in time range)  doxycycline (VIBRA-TABS) tablet 100 mg (has no administration in time range)     IMPRESSION / MDM / ASSESSMENT AND PLAN / ED COURSE  I reviewed the triage vital signs and the nursing notes.  Differential diagnosis includes, but is not limited to, cellulitis, abscess, no visible retained foreign body on ultrasound or by clinical history  Patient's presentation is most consistent with acute presentation with potential threat to life or bodily function.  33 year old male presenting to the emergency department for evaluation of abscess of forearm, likely related to history of IVDU.  Tachycardic on presentation, but significantly improved at the time of my initial evaluation.  Labs ordered from triage with normal white blood cell count.  Mild anemia without reported acute bleeding sources.  Reassuring BMP.  No evidence of sepsis.  Ultrasound did demonstrate 1 larger and 1 small area consistent with abscess.  I&D performed as above.  Patient reports improvement with oral antibiotics in the past with abscesses.  Will DC with prescription for doxycycline and Keflex.  Strict return precautions provided.  Patient discharged stable condition.     FINAL CLINICAL IMPRESSION(S) / ED DIAGNOSES   Final diagnoses:  Abscess of forearm, left     Rx / DC Orders   ED Discharge Orders          Ordered    cephALEXin (KEFLEX) 500 MG capsule  4 times daily        01/05/24 1229    doxycycline (VIBRA-TABS) 100 MG tablet  2 times daily        01/05/24 1229             Note:  This document was prepared using Dragon voice recognition software and may include unintentional dictation errors.   Levander Slate, MD 01/05/24 [phone]

## 2024-01-05 NOTE — ED Triage Notes (Addendum)
 Pt to ED via POV from home. Pt reports was injecting heroin and reports now has abscess in area x4 days. Pt's left forearm is swollen.

## 2024-01-05 NOTE — Discharge Instructions (Addendum)
 Take your antibiotics as directed.  Return to the ER if your symptoms are significantly worsening or not improving within 48 hours.  Otherwise follow-up with your primary care doctor for further evaluation.

## 2024-03-07 ENCOUNTER — Encounter: Payer: Self-pay | Admitting: Infectious Diseases

## 2024-03-07 ENCOUNTER — Ambulatory Visit: Payer: MEDICAID | Attending: Infectious Diseases | Admitting: Infectious Diseases

## 2024-03-07 ENCOUNTER — Other Ambulatory Visit
Admission: RE | Admit: 2024-03-07 | Discharge: 2024-03-07 | Disposition: A | Discharge status: Home / Self Care | Payer: MEDICAID | Source: Ambulatory Visit | Attending: Infectious Diseases | Admitting: Infectious Diseases

## 2024-03-07 VITALS — BP 96/62 | HR 82 | Temp 98.4°F | Ht 70.0 in | Wt 205.0 lb

## 2024-03-07 DIAGNOSIS — S22088D Other fracture of T11-T12 vertebra, subsequent encounter for fracture with routine healing: Secondary | ICD-10-CM | POA: Diagnosis not present

## 2024-03-07 DIAGNOSIS — R569 Unspecified convulsions: Secondary | ICD-10-CM | POA: Insufficient documentation

## 2024-03-07 DIAGNOSIS — F17210 Nicotine dependence, cigarettes, uncomplicated: Secondary | ICD-10-CM | POA: Insufficient documentation

## 2024-03-07 DIAGNOSIS — Z79899 Other long term (current) drug therapy: Secondary | ICD-10-CM | POA: Diagnosis not present

## 2024-03-07 DIAGNOSIS — B1920 Unspecified viral hepatitis C without hepatic coma: Secondary | ICD-10-CM | POA: Insufficient documentation

## 2024-03-07 DIAGNOSIS — S32018D Other fracture of first lumbar vertebra, subsequent encounter for fracture with routine healing: Secondary | ICD-10-CM | POA: Diagnosis not present

## 2024-03-07 DIAGNOSIS — Z23 Encounter for immunization: Secondary | ICD-10-CM | POA: Diagnosis not present

## 2024-03-07 DIAGNOSIS — F1120 Opioid dependence, uncomplicated: Secondary | ICD-10-CM | POA: Insufficient documentation

## 2024-03-07 LAB — HEPATIC FUNCTION PANEL
ALT: 34 U/L (ref 0–44)
AST: 34 U/L (ref 15–41)
Albumin: 3.8 g/dL (ref 3.5–5.0)
Alkaline Phosphatase: 104 U/L (ref 38–126)
Bilirubin, Direct: <0.1 mg/dL (ref 0.0–0.2)
Total Bilirubin: 0.7 mg/dL (ref 0.0–1.2)
Total Protein: 8.6 g/dL — ABNORMAL HIGH (ref 6.5–8.1)

## 2024-03-07 NOTE — Patient Instructions (Signed)
 You came in today for a routine follow-up visit one year after completing treatment for hepatitis C. You reported feeling generally good with improvements in your symptoms since finishing treatment. We discussed your ongoing intravenous drug use and your participation in a methadone  program.  YOUR PLAN:  -HISTORY OF HEPATITIS C, STATUS POST SUCCESSFUL TREATMENT: You completed treatment for hepatitis C one year ago, and your last test showed no detectable virus. However, because you continue to use intravenous drugs, there is a risk of reinfection. We will check your hepatitis C RNA and liver function tests to ensure the virus has not returned and to monitor your liver health.  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 You are using sterile needles and participating in a needle exchange program to reduce the risk of infection. We encourage you to continue with the methadone  program and the needle exchange program to minimize health risks.  INSTRUCTIONS:  Please follow up with the lab to have your hepatitis C RNA and liver function tests done. Continue with your methadone  maintenance therapy and participation in the needle exchange program. If you experience any new symptoms or issues, please contact our office.
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
## 2024-03-08 LAB — HCV RNA QUANT: HCV Quantitative: NOT DETECTED [IU]/mL (ref >50)

## 2024-03-18 ENCOUNTER — Ambulatory Visit: Payer: Self-pay

## 2024-03-26 ENCOUNTER — Other Ambulatory Visit: Payer: Self-pay | Admitting: Neurology

## 2024-03-26 DIAGNOSIS — R569 Unspecified convulsions: Secondary | ICD-10-CM

## 2024-03-27 NOTE — Telephone Encounter (Signed)
 Requested Prescriptions   Pending Prescriptions Disp Refills   Lacosamide  150 MG TABS [Pharmacy Med Name: LACOSAMIDE  150 MG TABLET] 180 tablet     Sig: TAKE 1 TABLET BY MOUTH TWICE A DAY   Last 09/19/23 Next appt not scheduled   Dispenses   Dispensed Days Supply Quantity Provider Pharmacy  LACOSAMIDE  150 MG TABLET 02/22/2024 30 60 each Camara, Amadou, MD [facility] [phone] - [facility]...  LACOSAMIDE  150 MG TABLET 01/16/2024 30 60 each Camara, Amadou, MD [id number] - [facility]...  LACOSAMIDE  150 MG TABLET 12/16/2023 30 60 each Camara, Amadou, MD [id number] - [facility]...  LACOSAMIDE  150 MG TABLET 11/13/2023 30 60 each Camara, Amadou, MD [id number] - [facility]...  LACOSAMIDE  150 MG TABLET 09/21/2023 30 60 each Camara, Amadou, MD [id number] - [facility]...  LACOSAMIDE  150 MG TABLET 08/23/2023 30 60 each Pender, Julie F, FNP [facility] [phone] - L...  LACOSAMIDE  50 MG TABLET 08/18/2023 30 60 each Pender, Julie F, FNP [facility] [phone] - L...  LACOSAMIDE  150 MG TABLET 07/21/2023 30 60 each Jonette Lauraine BRAVO, PA-C [facility] [phone] - L...  LACOSAMIDE  150 MG TABLET 06/21/2023 30 60 each Jonette Lauraine BRAVO, PA-C [facility] [phone] - L...  LACOSAMIDE  150 MG TABLET 05/02/2023 30 60 each Jonette Lauraine BRAVO, PA-C [facility] [phone] - L...  LACOSAMIDE  150 MG TABLET 04/04/2023 30 60 each Jonette Lauraine BRAVO, PA-C [facility] [phone] - L.SABRAS[facility]RA

## 2024-06-15 ENCOUNTER — Other Ambulatory Visit: Payer: Self-pay | Admitting: Nurse Practitioner

## 2024-06-15 DIAGNOSIS — M545 Low back pain, unspecified: Secondary | ICD-10-CM

## 2024-06-18 NOTE — Telephone Encounter (Signed)
 Requested medication (s) are due for refill today: yes  Requested medication (s) are on the active medication list: yes  Last refill:  01/04/24  Future visit scheduled: yes  Notes to clinic:  Unable to refill per protocol, cannot delegate.      Requested Prescriptions  Pending Prescriptions Disp Refills   tiZANidine  (ZANAFLEX ) 4 MG tablet [Pharmacy Med Name: TIZANIDINE  HCL 4 MG TABLET] 135 tablet 3    Sig: TAKE 1/2 - 1 AND 1/2 TABLETS BY MOUTH EVERY 8 HOURS AS NEEDED FOR MUSCLE SPASMS (MUSCLE TIGHTNESS).     Not Delegated - Cardiovascular:  Alpha-2 Agonists - tizanidine  Failed - 06/18/2024  1:51 PM      Failed - This refill cannot be delegated      Failed - Valid encounter within last 6 months    Recent Outpatient Visits           6 months ago Chronic bilateral low back pain without sciatica   [facility] Trevor Mliss FALCON, FNP       Future Appointments             In 8 months Trevor Bodily, MD [facility]

## 2024-08-13 ENCOUNTER — Other Ambulatory Visit: Payer: Self-pay

## 2025-03-06 ENCOUNTER — Ambulatory Visit: Payer: MEDICAID | Admitting: Infectious Diseases
